# Patient Record
Sex: Male | Born: 1995 | Race: White | Hispanic: No | Marital: Married | Smoking: Never smoker
Health system: Southern US, Community
[De-identification: ages and names within clinical notes are randomized; demographics above are authoritative.]

## PROBLEM LIST (undated history)

## (undated) DIAGNOSIS — B029 Zoster without complications: Secondary | ICD-10-CM

## (undated) DIAGNOSIS — S060X9A Concussion with loss of consciousness of unspecified duration, initial encounter: Secondary | ICD-10-CM

## (undated) DIAGNOSIS — J020 Streptococcal pharyngitis: Secondary | ICD-10-CM

## (undated) DIAGNOSIS — F419 Anxiety disorder, unspecified: Secondary | ICD-10-CM

## (undated) DIAGNOSIS — T7840XA Allergy, unspecified, initial encounter: Secondary | ICD-10-CM

## (undated) DIAGNOSIS — K219 Gastro-esophageal reflux disease without esophagitis: Secondary | ICD-10-CM

## (undated) HISTORY — DX: Gastro-esophageal reflux disease without esophagitis: K21.9

## (undated) HISTORY — DX: Anxiety disorder, unspecified: F41.9

## (undated) HISTORY — DX: Streptococcal pharyngitis: J02.0

## (undated) HISTORY — DX: Allergy, unspecified, initial encounter: T78.40XA

## (undated) HISTORY — DX: Zoster without complications: B02.9

## (undated) HISTORY — PX: WISDOM TOOTH EXTRACTION: SHX21

## (undated) HISTORY — DX: Concussion with loss of consciousness of unspecified duration, initial encounter: S06.0X9A

---
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
## 2015-09-10 ENCOUNTER — Ambulatory Visit (INDEPENDENT_AMBULATORY_CARE_PROVIDER_SITE_OTHER): Payer: BLUE CROSS/BLUE SHIELD | Admitting: Primary Care

## 2015-09-10 VITALS — BP 120/72 | HR 69 | Temp 98.1°F | Ht 66.25 in | Wt 169.4 lb

## 2015-09-10 DIAGNOSIS — H6123 Impacted cerumen, bilateral: Secondary | ICD-10-CM | POA: Diagnosis not present

## 2015-09-10 DIAGNOSIS — H938X2 Other specified disorders of left ear: Secondary | ICD-10-CM | POA: Diagnosis not present

## 2015-09-10 NOTE — Progress Notes (Signed)
   Subjective:    Patient ID: MILAM ALLBAUGH, male    DOB: November 19, 1995, 20 y.o.   MRN: [id number]  HPI  Mr. Nazaire is a 20 year old male who presents today with a chief complaint of ear fullness. The fullness is located to the left ear and has been present since Saturday morning. He cleaned out his left ear at home with a ear wax removal kit last night. He was able to remove some wax but continues to experience fullness. Denies pain, fevers, sore throat, cough, right sided pain. He's taken Claritin once without much improvement.    Review of Systems  Constitutional: Negative for fever.  HENT: Negative for congestion, ear pain, rhinorrhea and sore throat.        Ear fullness  Respiratory: Negative for cough.   Musculoskeletal: Negative for myalgias.       Past Medical History  Diagnosis Date  . Strep throat   . Zoster 11/2009  . Concussion 2009    x2 (football/baseball)     Social History   Social History  . Marital Status: Single    Spouse Name: N/A  . Number of Children: N/A  . Years of Education: N/A   Occupational History  . Not on file.   Social History Main Topics  . Smoking status: Never Smoker   . Smokeless tobacco: Never Used  . Alcohol Use: No  . Drug Use: No  . Sexual Activity: Not on file   Other Topics Concern  . Not on file   Social History Narrative  . No narrative on file    No past surgical history on file.  No family history on file.  No Known Allergies  Current Outpatient Prescriptions on File Prior to Visit  Medication Sig Dispense Refill  . fluticasone (FLONASE) 50 MCG/ACT nasal spray Place 2 sprays into both nostrils daily. 16 g 1  . ibuprofen (ADVIL,MOTRIN) 200 MG tablet Take 200 mg by mouth as needed.       No current facility-administered medications on file prior to visit.    BP 120/72 mmHg  Pulse 69  Temp(Src) 98.1 F (36.7 C) (Oral)  Ht 5' 6.25" (1.683 m)  Wt 169 lb 6.4 oz (76.839 kg)  BMI 27.13 kg/m2  SpO2  99%    Objective:   Physical Exam  Constitutional: He appears well-nourished.  HENT:  Right Ear: Tympanic membrane and ear canal normal.  Left Ear: Tympanic membrane and ear canal normal.  Nose: No mucosal edema. Right sinus exhibits no maxillary sinus tenderness and no frontal sinus tenderness. Left sinus exhibits no maxillary sinus tenderness and no frontal sinus tenderness.  Mouth/Throat: Oropharynx is clear and moist.  Bilateral cerumen impaction to canals. TM's post irrigation WNL. No erythema, bulging. BLM's visualized.  Eyes: Conjunctivae are normal.  Neck: Neck supple.  Cardiovascular: Normal rate and regular rhythm.   Pulmonary/Chest: Effort normal and breath sounds normal. He has no wheezes. He has no rales.  Skin: Skin is warm and dry.          Assessment & Plan:  Cerumen Impaction:  Ear fullness for the past several days. No cough, sore throat, fevers, etc. Exam today with moderate cerumen impaction to bilateral canals. TM's post irrigation WNL. No evidence of acute infection. Discussed to avoid q-tips and use of debrox drops to prevent future impaction. Follow up PRN.

## 2015-09-10 NOTE — Patient Instructions (Signed)
Both of your ears were impacted with wax which was contributing to your symptoms.  There is no evidence of infection in your ears.  Continue use of ear wax removal kits or Debrox drops for future impaction.  It was a pleasure meeting you!  Cerumen Impaction The structures of the external ear canal secrete a waxy substance known as cerumen. Excess cerumen can build up in the ear canal, causing a condition known as cerumen impaction. Cerumen impaction can cause ear pain and disrupt the function of the ear. The rate of cerumen production differs for each individual. In certain individuals, the configuration of the ear canal may decrease his or her ability to naturally remove cerumen. CAUSES Cerumen impaction is caused by excessive cerumen production or buildup. RISK FACTORS  Frequent use of swabs to clean ears.  Having narrow ear canals.  Having eczema.  Being dehydrated. SIGNS AND SYMPTOMS  Diminished hearing.  Ear drainage.  Ear pain.  Ear itch. TREATMENT Treatment may involve:  Over-the-counter or prescription ear drops to soften the cerumen.  Removal of cerumen by a health care provider. This may be done with:  Irrigation with warm water. This is the most common method of removal.  Ear curettes and other instruments.  Surgery. This may be done in severe cases. HOME CARE INSTRUCTIONS  Take medicines only as directed by your health care provider.  Do not insert objects into the ear with the intent of cleaning the ear. PREVENTION  Do not insert objects into the ear, even with the intent of cleaning the ear. Removing cerumen as a part of normal hygiene is not necessary, and the use of swabs in the ear canal is not recommended.  Drink enough water to keep your urine clear or pale yellow.  Control your eczema if you have it. SEEK MEDICAL CARE IF:  You develop ear pain.  You develop bleeding from the ear.  The cerumen does not clear after you use ear drops as  directed.   This information is not intended to replace advice given to you by your health care provider. Make sure you discuss any questions you have with your health care provider.   Document Released: 04/16/2004 Document Revised: 03/30/2014 Document Reviewed: 10/24/2014 Elsevier Interactive Patient Education [employer]2016 Elsevier Inc.

## 2015-09-10 NOTE — Progress Notes (Signed)
Pre visit review using our clinic review tool, if applicable. No additional management support is needed unless otherwise documented below in the visit note. 

## 2016-04-01 ENCOUNTER — Encounter: Payer: Self-pay | Admitting: Family Medicine

## 2016-04-01 ENCOUNTER — Ambulatory Visit (INDEPENDENT_AMBULATORY_CARE_PROVIDER_SITE_OTHER): Payer: BLUE CROSS/BLUE SHIELD | Admitting: Family Medicine

## 2016-04-01 VITALS — BP 110/72 | HR 80 | Temp 98.4°F | Wt 166.8 lb

## 2016-04-01 DIAGNOSIS — J302 Other seasonal allergic rhinitis: Secondary | ICD-10-CM

## 2016-04-01 DIAGNOSIS — B9789 Other viral agents as the cause of diseases classified elsewhere: Secondary | ICD-10-CM | POA: Diagnosis not present

## 2016-04-01 DIAGNOSIS — J069 Acute upper respiratory infection, unspecified: Secondary | ICD-10-CM

## 2016-04-01 MED ORDER — FLUTICASONE PROPIONATE 50 MCG/ACT NA SUSP: 2.0000 | Freq: Every day | NASAL | 1 refills | Status: DC

## 2016-04-01 NOTE — Patient Instructions (Addendum)
For nasal congestion you can use Afrin nasal spray for 3 days max, Sudafed, saline nasal spray (generic is fine for all). For cough you can try Delsym. Drink enough fluids to make your urine light yellow. For fever/chill/muscle aches you can take over the counter acetaminophen or ibuprofen.  Please come back in if you are not better in 5-7 days or if you develop wheezing, shortness of breath or persistent vomiting.  Start Flonase when leaves start to come on trees.

## 2016-04-01 NOTE — Progress Notes (Signed)
   Subjective:    Patient ID: Dillon BalesCody G Mayers, male    DOB: Jul 28, 1995, 21 y.o.   MRN: [id number]  HPI This is a 21 yo male who presents today with 2 days of runny nose and sore throat. Some congestion. No cough. Slight headache. No ear pain. No fever. Has taken some Claritin without relief. Father with flu like illness. Has chronic spring allergies. No history asthma. Has had frequent strep, this feels different.    Past Medical History:  Diagnosis Date  . Concussion 2009   x2 (football/baseball)  . Strep throat   . Zoster 11/2009   No past surgical history on file. No family history on file. Social History  Substance Use Topics  . Smoking status: Never Smoker  . Smokeless tobacco: Never Used  . Alcohol use No      Review of Systems Per HPI    Objective:   Physical Exam  Constitutional: He is oriented to person, place, and time. He appears well-developed and well-nourished. No distress.  HENT:  Head: Normocephalic and atraumatic.  Right Ear: Tympanic membrane, external ear and ear canal normal.  Left Ear: Tympanic membrane, external ear and ear canal normal.  Nose: Mucosal edema and rhinorrhea present. Right sinus exhibits no maxillary sinus tenderness and no frontal sinus tenderness. Left sinus exhibits no maxillary sinus tenderness and no frontal sinus tenderness.  Mouth/Throat: Uvula is midline and oropharynx is clear and moist.  Eyes: Conjunctivae are normal.  Neck: Normal range of motion. Neck supple.  Cardiovascular: Normal rate, regular rhythm and normal heart sounds.   Pulmonary/Chest: Effort normal and breath sounds normal.  Lymphadenopathy:    He has no cervical adenopathy.  Neurological: He is alert and oriented to person, place, and time.  Skin: Skin is warm and dry. He is not diaphoretic.  Psychiatric: He has a normal mood and affect. His behavior is normal. Judgment and thought content normal.  Vitals reviewed.     BP 110/72 (BP Location: Left Arm,  Patient Position: Sitting, Cuff Size: Normal)   Pulse 80   Temp 98.4 F (36.9 C) (Oral)   Wt 166 lb 12 oz (75.6 kg)   SpO2 97%   BMI 26.71 kg/m  Wt Readings from Last 3 Encounters:  04/01/16 166 lb 12 oz (75.6 kg)  09/10/15 169 lb 6.4 oz (76.8 kg) (71 %, Z= 0.56)*  01/30/13 166 lb (75.3 kg) (81 %, Z= 0.88)*   * Growth percentiles are based on CDC 2-20 Years data.       Assessment & Plan:  1. Viral URI -  Patient Instructions  For nasal congestion you can use Afrin nasal spray for 3 days max, Sudafed, saline nasal spray (generic is fine for all). For cough you can try Delsym. Drink enough fluids to make your urine light yellow. For fever/chill/muscle aches you can take over the counter acetaminophen or ibuprofen.  Please come back in if you are not better in 5-7 days or if you develop wheezing, shortness of breath or persistent vomiting.  Start Flonase when leaves start to come on trees.     2. Chronic seasonal allergic rhinitis, unspecified trigger - fluticasone (FLONASE) 50 MCG/ACT nasal spray; Place 2 sprays into both nostrils daily.  Dispense: 16 g; Refill: 1   Olean Reeeborah Gessner, FNP-BC  [facility] [place], [facility]  04/01/2016 8:23 AM

## 2018-10-07 DIAGNOSIS — S8002XA Contusion of left knee, initial encounter: Secondary | ICD-10-CM | POA: Insufficient documentation

## 2019-09-21 DIAGNOSIS — U071 COVID-19: Secondary | ICD-10-CM

## 2019-09-21 HISTORY — DX: COVID-19: U07.1

## 2019-10-09 ENCOUNTER — Telehealth: Payer: Self-pay

## 2019-10-09 NOTE — Telephone Encounter (Signed)
Discussed with Dr. Reece Agar and he said he will keep the Dillon Park and if Dillon Park is having any symptoms Dillon Park should have a VV.   Contacted mother, Marylene Land, and she reports Dillon Park is not home from college but home from a trip. Dillon Park is currently c/o cough but denies all other symptoms. Next available opening for VV is 7/23. Scheduled Dillon Park and sent link for mychart. Explained VV and how it will work. Advised if any new or worsening symptoms to contact office. Marylene Land verbalized understanding.

## 2019-10-09 NOTE — Telephone Encounter (Signed)
[facility] Night - Client TELEPHONE ADVICE RECORD AccessNurse Patient Name: HJALMAR BALLENGEE Gender: Male DOB: [birth year] Age: 24 Y 7 M 6 D Return Phone Number: [phone] (Primary), [phone] (Secondary) Address: [place] [address] Client [facility] Night - Client Client Site [facility] - Night Physician Raechel Ache - MD Contact Type Call Who Is Calling Patient / Member / Family / Caregiver Call Type Triage / Clinical Caller Name Dhanush Jokerst Relationship To Patient Mother Return Phone Number [phone] (Primary) Chief Complaint Headache Reason for Call Symptomatic / Request for Health Information Initial Comment Son tested pos for covid today. He has headache and temp is 97 (was 101). Translation No Nurse Assessment Nurse: Loletta Specter Date/Time Lamount Cohen Time): 10/08/2019 3:34:04 PM Confirm and document reason for call. If symptomatic, describe symptoms. ---Son tested positive for Covid today. He has headache and temp is 97 (was 101 orally). Has the patient had close contact with a person known or suspected to have the novel coronavirus illness OR traveled / lives in area with major community spread (including international travel) in the last 14 days from the onset of symptoms? * If Asymptomatic, screen for exposure and travel within the last 14 days. ---Yes Does the patient have any new or worsening symptoms? ---Yes Will a triage be completed? ---Yes Related visit to physician within the last 2 weeks? ---Yes Does the PT have any chronic conditions? (i.e. diabetes, asthma, this includes High risk factors for pregnancy, etc.) ---No Is this a behavioral health or substance abuse call? ---No Guidelines Guideline Title Affirmed Question Affirmed Notes Nurse Date/Time (Eastern Time) COVID-19 - Diagnosed or Suspected [1] COVID-19 diagnosed by positive lab test AND [2] mild  symptoms (e.g., cough, fever, others) AND [3] no complications or SOB Loletta Specter 10/08/2019 3:35:40 PM Disp. Time (Eastern Time) Disposition Final UserPLEASE NOTE: All timestamps contained within this report are represented as [place] Standard Time. CONFIDENTIALTY NOTICE: This fax transmission is intended only for the addressee. It contains information that is legally privileged, confidential or otherwise protected from use or disclosure. If you are not the intended recipient, you are strictly prohibited from reviewing, disclosing, copying using or disseminating any of this information or taking any action in reliance on or regarding this information. If you have received this fax in error, please notify us immediately by telephone so that we can arrange for its return to [place]. Phone: [phone], Toll-Free: [phone], Fax: [phone] Page: 2 of 2 Call Id: [id number] 10/08/2019 3:45:28 PM Home Care Yes Loletta Specter Caller Disagree/Comply Comply Caller Understands Yes PreDisposition Search internet for information Care Advice Given Per Guideline HOME CARE: * You should be able to treat this at home. * You had a lab test for COVID-19 and it came back positive. * Muscle aches, headache, and other pains: Often this comes and goes with the fever. Take acetaminophen every 4-6 hours (Adults 650 mg) OR ibuprofen every 6 to 8 hours (Adults 400 mg). Before taking any medicine, read all the instructions on the package. * Feeling dehydrated: Drink extra liquids. If the air in your home is dry, use a humidifier. * HOME REMEDY - HONEY: This old home remedy has been shown to help decrease coughing at night. The adult dosage is 2 teaspoons (10 ml) at bedtime. Honey should not be given to infants under one year of age. * If the air is dry, use a humidifier in the bedroom. HUMIDIFIER: HOW TO PROTECT OTHERS - WHEN  YOU ARE SICK WITH COVID-19: * STAY HOME A MINIMUM OF 10 DAYS: Home isolation is needed for at  least 10 days after the symptoms started. Stay home from school or work if you are sick. Do NOT go to religious services, child care centers, shopping, or other public places. Do NOT use public transportation (e.g., bus, taxis, ride-sharing). Do NOT allow any visitors to your home. Leave the house only if you need to seek urgent medical care. * COVER THE COUGH: Cough and sneeze into your shirt sleeve or inner elbow. Don't cough into your hand or the air. If available, cough into a tissue and throw it into a trash can. OTHER COVID-19 FACTS: * INCUBATION PERIOD: Average 5 days (range 2 to 14 days) after coming in contact with a person who has COVID-19 virus. * WEAR A MASK: Wear a facemask when around others. Always wear a facemask (if available) if you have to leave your home (such as going to a medical facility). * WASH HANDS OFTEN: Wash hands often with soap and water. After coughing or sneezing are important times. If soap and water are not available, use an alcohol-based hand sanitizer with at least 60% alcohol, covering all surfaces of your hands and rubbing them together until they feel dry. Avoid touching your eyes, nose, and mouth with unwashed hands. CALL BACK IF: * Fever over 103 F (39.4 C) * Fever lasts over 3 days * You become worse. CARE ADVICE given per COVID-19 - DIAGNOSED OR SUSPECTED (Adult) guideline. * Chest pain or difficulty breathing occurs

## 2019-10-09 NOTE — Telephone Encounter (Signed)
Noted  

## 2019-10-09 NOTE — Telephone Encounter (Signed)
Contacted pt's mother, Marylene Land, and advised pt is considered a new pt since he has not been seen in over 3 years and has not been seen by his PCP in over 7 years.  She reports pt came home on Friday and was not feeling well and was tested at [facility] and was positive. They are supposed to call with instructions on what to do. Advised to quarantine until contacted and to have [place] contact office after quarantine and reestablish care with a new provider. Advised to contact office if any problems. Marylene Land verbalized understanding.

## 2019-10-10 NOTE — Telephone Encounter (Signed)
Noted. I could see Thurs at 10am (30 min appt)  Or would offer sooner virtual appt with another provider if sooner availability and ok with the other provider.

## 2019-10-11 NOTE — Telephone Encounter (Signed)
Contacted pt's father, Gery Pray, who reports Dillon Park is sleeping and has been tired since yesterday. Amante is doing better and does not have any new symptoms. He and their other son have been tested and both are negative. Dillon Park has not been tested yet. Opening on schedule has been filled. Advised if any changes in symptoms or Dillon Park has changes in symptoms to contact this office. Gery Pray verbalized understanding.

## 2019-10-13 ENCOUNTER — Telehealth: Payer: Self-pay

## 2019-10-13 ENCOUNTER — Other Ambulatory Visit: Payer: Self-pay

## 2019-10-13 ENCOUNTER — Encounter: Payer: Self-pay | Admitting: Family Medicine

## 2019-10-13 ENCOUNTER — Telehealth: Payer: Self-pay | Admitting: Family Medicine

## 2019-10-13 DIAGNOSIS — U071 COVID-19: Secondary | ICD-10-CM | POA: Insufficient documentation

## 2019-10-13 NOTE — Telephone Encounter (Signed)
Spoke with pt getting him ready for virtual visit.  

## 2019-10-13 NOTE — Telephone Encounter (Signed)
Called one last time to try to get pt on for video visit.  Spoke with pt's mom.  She says pt does not want the appt.  She was who initially scheduled it and pt is not interested.  FYI to Dr. Reece Agar.

## 2019-10-13 NOTE — Assessment & Plan Note (Signed)
Visit not completed.

## 2019-10-13 NOTE — Telephone Encounter (Signed)
Left message with pt's dad asking him call our office to prepare for virtual visit.  Dr. Reece Agar may be able to see pt this morning.  Dad will have pt call back.

## 2019-10-13 NOTE — Progress Notes (Addendum)
   Virtual visit attempted through MyChart, a video enabled telemedicine application. Due to national recommendations of social distancing due to COVID-19, a virtual visit is felt to be most appropriate for this patient at this time.   If any vitals were documented, they were collected by patient at home unless specified below.    Temp 97.8 F (36.6 C)   Ht 5\' 8"  (1.727 m)   Wt 170 lb (77.1 kg)   BMI 25.85 kg/m    CC: COVID positive Subjective:    Patient ID: Dillon Park, male    DOB: 1995-06-17, 24 y.o.   MRN: [id number]  HPI: Dillon Park is a 24 y.o. male presenting on 10/13/2019 for Fatigue (States fatigue, loss of taste/smell, body aches and fever. Sxs started 10/06/19   [facility] )   First day of symptoms 10/06/2019.  [facility] test at [facility]/08/2019.   I WAS UNABLE TO REACH PATIENT DESPITE MULTIPLE ATTEMPTS BY MYSELF AND MY CMA. MY CMA WAS FINALLY ABLE TO REACH PATIENT AFTER 4PM - HE STATES HE DOES NOT WANT APPOINTMENT AS HE'S FEELING BETTER.      Relevant past medical, surgical, family and social history reviewed and updated as indicated. Interim medical history since our last visit reviewed. Allergies and medications reviewed and updated. Outpatient Medications Prior to Visit  Medication Sig Dispense Refill  . ibuprofen (ADVIL,MOTRIN) 200 MG tablet Take 200 mg by mouth as needed.      . fluticasone (FLONASE) 50 MCG/ACT nasal spray Place 2 sprays into both nostrils daily. 16 g 1   No facility-administered medications prior to visit.     Per HPI unless specifically indicated in ROS section below Review of Systems Objective:  Temp 97.8 F (36.6 C)   Ht 5\' 8"  (1.727 m)   Wt 170 lb (77.1 kg)   BMI 25.85 kg/m   Wt Readings from Last 3 Encounters:  10/13/19 170 lb (77.1 kg)  04/01/16 166 lb 12 oz (75.6 kg)  09/10/15 169 lb 6.4 oz (76.8 kg) (71 %, Z= 0.56)*   * Growth percentiles are based on CDC (Boys, 2-20 Years) data.           Assessment & Plan:   Problem List Items Addressed This Visit    None       No orders of the defined types were placed in this encounter.  No orders of the defined types were placed in this encounter.   I discussed the assessment and treatment plan with the patient. The patient was provided an opportunity to ask questions and all were answered. The patient agreed with the plan and demonstrated an understanding of the instructions. The patient was advised to call back or seek an in-person evaluation if the symptoms worsen or if the condition fails to improve as anticipated.  Follow up plan: No follow-ups on file.  05/30/16, MD

## 2020-03-23 HISTORY — PX: BUNIONECTOMY: SHX129

## 2020-04-01 ENCOUNTER — Other Ambulatory Visit: Payer: Self-pay

## 2020-04-01 ENCOUNTER — Ambulatory Visit (INDEPENDENT_AMBULATORY_CARE_PROVIDER_SITE_OTHER): Payer: BC Managed Care – PPO

## 2020-04-01 ENCOUNTER — Ambulatory Visit (INDEPENDENT_AMBULATORY_CARE_PROVIDER_SITE_OTHER): Payer: BC Managed Care – PPO | Admitting: Podiatry

## 2020-04-01 ENCOUNTER — Encounter: Payer: Self-pay | Admitting: Podiatry

## 2020-04-01 DIAGNOSIS — M2011 Hallux valgus (acquired), right foot: Secondary | ICD-10-CM | POA: Diagnosis not present

## 2020-04-01 DIAGNOSIS — M2010 Hallux valgus (acquired), unspecified foot: Secondary | ICD-10-CM

## 2020-04-01 DIAGNOSIS — B351 Tinea unguium: Secondary | ICD-10-CM

## 2020-04-01 DIAGNOSIS — M2012 Hallux valgus (acquired), left foot: Secondary | ICD-10-CM

## 2020-04-01 DIAGNOSIS — M21611 Bunion of right foot: Secondary | ICD-10-CM

## 2020-04-01 DIAGNOSIS — B353 Tinea pedis: Secondary | ICD-10-CM

## 2020-04-01 DIAGNOSIS — M21612 Bunion of left foot: Secondary | ICD-10-CM

## 2020-04-01 MED ORDER — TERBINAFINE HCL 250 MG PO TABS: 250.0000 mg | ORAL_TABLET | Freq: Every day | ORAL | 0 refills | Status: AC

## 2020-04-01 NOTE — Patient Instructions (Addendum)
The specific procedure I would do for your bunions is known as a "Lapidus procedure" if you would like to do more research on this   Bunion Surgery  Bunion surgery is done to remove a bunion, which is a bump that forms slowly on the inner side of the big toe joint. A bunion can develop over time when pressure turns the big toe toward the second toe. Bunions may be caused by wearing shoes that are narrow or pointed. They can also be caused by certain conditions, such as rheumatoid arthritis and flat feet. You may need bunion surgery if your bunion is very large or painful or it affects your ability to walk. Tell a health care provider about:  Any allergies you have.  All medicines you are taking, including vitamins, herbs, eye drops, creams, and over-the-counter medicines.  Any problems you or family members have had with anesthetic medicines.  Any blood disorders you have.  Any surgeries you have had.  Any medical conditions you have.  Whether you are pregnant or may be pregnant. What are the risks? Generally, this is a safe procedure. However, problems may occur, including:  Infection.  Bleeding or blood clots.  Allergic reactions to medicines.  Nerve damage.  Pain, numbness, stiffness, or arthritis in the toe.  Return of the bunion.  Failure of the bone to heal completely after surgery, or failure of the surgery to relieve symptoms. What happens before the procedure? Staying hydrated Follow instructions from your health care provider about hydration, which may include:  Up to 2 hours before the procedure - you may continue to drink clear liquids, such as water, clear fruit juice, black coffee, and plain tea.   Eating and drinking restrictions  Follow instructions from your health care provider about eating and drinking, which may include: ? 8 hours before the procedure - stop eating heavy meals or foods, such as meat, fried foods, or fatty foods. ? 6 hours before the  procedure - stop eating light meals or foods, such as toast or cereal. ? 6 hours before the procedure - stop drinking milk or drinks that contain milk. ? 2 hours before the procedure - stop drinking clear liquids.  Do not drink alcohol for the length of time you are told by your health care provider. Medicines Ask your health care provider about:  Changing or stopping your regular medicines. This is especially important if you are taking diabetes medicines or blood thinners.  Taking medicines such as aspirin and ibuprofen. These medicines can thin your blood. Do not take these medicines unless your health care provider tells you to take them.  Taking over-the-counter medicines, vitamins, herbs, and supplements. General instructions  Do not use any products that contain nicotine or tobacco for at least 4 weeks before the procedure. These products include cigarettes, e-cigarettes, and chewing tobacco. If you need help quitting, ask your health care provider.  Plan to have a responsible adult take you home from the hospital or clinic.  If you will be going home right after the procedure, plan to have a responsible adult care for you for the time you are told. This is important.  Ask your health care provider: ? How your surgery site will be marked. ? What steps will be taken to help prevent infection. These steps may include:  Washing skin with a germ-killing soap.  Taking antibiotic medicine. What happens during the procedure?  An IV will be inserted into one of your veins.  You will  be given one or more of the following: ? A medicine to help you relax (sedative). ? A medicine to numb the area (local anesthetic). ? A medicine that is injected into an area of your body to numb everything below the injection site (regional anesthetic). ? A medicine to make you fall asleep (general anesthetic).  An incision will be made over the bump at the big toe joint. Depending on the type of  procedure that is needed for your bunion, your surgeon may do one or more of the following: ? Exostectomy. This is a procedure to remove the bunion. ? Osteotomy. This is a procedure to cut and realign the damaged joint in your big toe. ? Arthrodesis. For the procedure, hardware, such as screws, is used to keep your foot in the correct position. ? Tightening or loosening tissues around the big toe to reposition the toe.  The incision will be closed with stitches (sutures) and covered with adhesive strips or another type of bandage (dressing).  A supportive device, such as a boot, may be placed on your foot. A boot is a cast that you can walk on. The procedure may vary among health care providers and hospitals. What happens after the procedure?  Your blood pressure, heart rate, breathing rate, and blood oxygen level will be monitored until you leave the hospital or clinic.  If you were given a sedative during the procedure, it can affect you for several hours. Do not drive or operate machinery until your health care provider says that it is safe.  Ask your health care provider when it is safe to drive if you have a boot or other supportive device on your foot.  You may be given instructions about weight-bearing restrictions. These instructions tell you how much body weight you can or cannot support on your foot. You may be given crutches, a cane, or a walker to help you move around so that you do not put (bear) weight on your foot. Summary  Bunion surgery is done to remove a bunion, which is a bump that forms slowly on the inner side of the big toe joint.  You may need bunion surgery if your bunion is very large or painful or it affects your ability to walk.  Before the procedure, follow instructions from your health care provider about eating and drinking, and ask about changing or stopping your regular medicines. This information is not intended to replace advice given to you by your health  care provider. Make sure you discuss any questions you have with your health care provider. Document Revised: 07/14/2019 Document Reviewed: 07/14/2019 Elsevier Patient Education  2021 [employer]   Bunion A bunion (hallux valgus) is a bump that forms slowly on the inner side of the big toe joint. It occurs when the big toe turns toward the second toe. Bunions may be small at first, but they often get larger over time. They can make walking painful. What are the causes? This condition may be caused by:  Wearing narrow or pointed shoes that force the big toe to press against the other toes.  Abnormal foot development that causes the foot to roll inward.  Changes in the foot that are caused by certain diseases, such as rheumatoid arthritis or polio.  A foot injury. What increases the risk? The following factors may make you more likely to develop this condition:  Wearing shoes that squeeze the toes together.  Having certain diseases, such as: ? Rheumatoid arthritis. ? Polio. ?  Cerebral palsy.  Having family members who have bunions.  Being born with abnormally shaped feet (a foot deformity), such as flat feet or low arches.  Doing activities that put a lot of pressure on the feet, such as ballet dancing. What are the signs or symptoms? The main symptom of this condition is a bump on your big toe that you can notice. Other symptoms may include:  Pain.  Redness and inflammation around your big toe.  Thick or hardened skin on your big toe or between your toes.  Stiffness or loss of motion in your big toe.  Trouble with walking.   How is this diagnosed? This condition may be diagnosed based on your symptoms, medical history, and activities. You may also have tests and imaging, such as:  X-rays. These allow your health care provider to check the position of the bones in your foot and look for damage to your joint. They also help your health care provider determine the  severity of your bunion and the best way to treat it.  Joint aspiration. In this test, a sample of fluid is removed from the toe joint. This test may be done if you are in a lot of pain. It helps rule out diseases that cause painful swelling of the joints, such as arthritis or gout. How is this treated? Treatment depends on the severity of your symptoms. The goal of treatment is to relieve symptoms and prevent your bunion from getting worse. Your health care provider may recommend:  Wearing shoes that have a wide toe box, or using bunion pads to cushion the affected area.  Taping your toes together to keep them in a normal position.  Placing a device inside your shoe (orthotic device) to help reduce pressure on your toe joint.  Taking medicine to ease pain and inflammation.  Putting ice or heat on the affected area.  Doing stretching exercises.  Surgery, for severe cases. Follow these instructions at home: Managing pain, stiffness, and swelling  If directed, put ice on the painful area. To do this: ? Put ice in a plastic bag. ? Place a towel between your skin and the bag. ? Leave the ice on for 20 minutes, 2-3 times a day. ? Remove the ice if your skin turns bright red. This is very important. If you cannot feel pain, heat, or cold, you have a greater risk of damage to the area.  If directed, apply heat to the affected area before you exercise. Use the heat source that your health care provider recommends, such as a moist heat pack or a heating pad. ? Place a towel between your skin and the heat source. ? Leave the heat on for 20-30 minutes. ? Remove the heat if your skin turns bright red. This is especially important if you are unable to feel pain, heat, or cold. You have a greater risk of getting burned.      General instructions  Do exercises as told by your health care provider.  Support your toe joint with proper footwear, shoe padding, or taping as told by your health care  provider.  Take over-the-counter and prescription medicines only as told by your health care provider.  Do not use any products that contain nicotine or tobacco, such as cigarettes, e-cigarettes, and chewing tobacco. If you need help quitting, ask your health care provider.  Keep all follow-up visits. This is important. Contact a health care provider if:  Your symptoms get worse.  Your symptoms do  not improve in 2 weeks. Get help right away if:  You have severe pain and trouble with walking. Summary  A bunion is a bump on the inner side of the big toe joint that forms when the big toe turns toward the second toe.  Bunions can make walking painful.  Treatment depends on the severity of your symptoms.  Support your toe joint with proper footwear, shoe padding, or taping as told by your health care provider. This information is not intended to replace advice given to you by your health care provider. Make sure you discuss any questions you have with your health care provider. Document Revised: 07/14/2019 Document Reviewed: 07/14/2019 Elsevier Patient Education  2021 [employer].

## 2020-04-02 ENCOUNTER — Encounter: Payer: Self-pay | Admitting: Podiatry

## 2020-04-02 NOTE — Progress Notes (Signed)
  Subjective:  Patient ID: Dillon Park, male    DOB: June 26, 1995,  MRN: [id number]  Chief Complaint  Patient presents with  . Bunions  . Nail Problem    Patient presents today for bilat bunions x years and nail fungus left great toe, 3rd and 4th toe x 2 years    25 y.o. male presents with the above complaint. History confirmed with patient.  The bunions and his feet hurt most when he is playing hockey.  Because of the width of the bunions he has had to buy larger skates.  This is causing him to piston in his shoes and cause damage to the toenails.  He also notes discoloration and the hallux toenail in the left side most the pain is along the arch just behind the big toe  Objective:  Physical Exam: warm, good capillary refill, no trophic changes or ulcerative lesions, normal DP and PT pulses and normal sensory exam.  Bilaterally he has moderate to severe bunions with hypermobility of the first tarsometatarsal joint.  Pinch callus on the medial MTPJ.  Pain on palpation along the abductor hallucis muscle belly.  He has discoloration of the left hallux, third and fourth digits with hyperkeratosis and thickening of the nail plate.  Mild tinea pedis interdigitally and the plantar distal foot  Radiographs: X-ray of both feet: Moderate to severe hallux valgus with increased IM angle and hallux abduction Assessment:   1. Valgus deformity of great toe, unspecified laterality   2. Hallux valgus with bunions, left   3. Hallux valgus with bunions, right   4. Onychomycosis   5. Tinea pedis of both feet      Plan:  Patient was evaluated and treated and all questions answered.  Discussed the etiology and treatment options for the condition in detail with the patient. Educated patient on the topical and oral treatment options for mycotic nails.  Discussed treatment options including appropriate shoe gear. Follow up as needed for painful nails.  Recommended oral treatment with Lamisil.  90-day course  sent to his pharmacy.  This will also be helpful to treat his tinea pedis.  If that does not improve with oral therapy we will also add topical therapy  Discussed the etiology and treatment including surgical and non surgical treatment for painful bunions.  He has exhausted all non surgical treatment prior to this visit including shoe gear changes and padding.  He desires surgical intervention. We discussed all risks including but not limited to: pain, swelling, infection, scar, numbness which may be temporary or permanent, chronic pain, stiffness, nerve pain or damage, wound healing problems, bone healing problems including delayed or non-union and recurrence. Specifically we discussed the following procedures: Lapidus bunionectomy bilaterally stage I foot at a time.  I discussed with him that for the right side he would not be able to drive after surgery.  We discussed the postoperative course and period of nonweightbearing for approximately 2 weeks before he is allowed to partially weight-bear in a CAM boot.  He would like to plan for surgery in the summer when it is off season for hockey.  He will return in a few months to schedule surgery and sign consent     Return in about 10 weeks (around 06/10/2020) for nail re-check.

## 2020-04-09 ENCOUNTER — Telehealth: Payer: Self-pay

## 2020-04-09 NOTE — Telephone Encounter (Signed)
Pt would like to know approximately how much the bunion surgery would cost, how long the recovery time for going back to regular hockey routine and if this could be done late April? Please advise.

## 2020-04-09 NOTE — Telephone Encounter (Signed)
Cost-wise it depends on his insurance, it is covered by insurance but it depends on what his out of pocket plan, deductible (and if he's met any of it), etc is. We can give him a rough idea but not until it's scheduled and authorized. April is wide open for me right now. I will need to see him before then to sign consent and pick an exact date (will be a Friday). Recovery wise, he will be no weight in a splint/cast for 2 weeks, then partial weight in a boot for about 2-3 weeks and then full weight for 2-3 weeks. Expect to not be in regular shoes (and hockey skates) until at least 2 months out from surgery. Possibly 3 if he has issues

## 2020-06-05 ENCOUNTER — Ambulatory Visit (INDEPENDENT_AMBULATORY_CARE_PROVIDER_SITE_OTHER): Payer: BC Managed Care – PPO | Admitting: Podiatry

## 2020-06-05 ENCOUNTER — Encounter: Payer: Self-pay | Admitting: Podiatry

## 2020-06-05 ENCOUNTER — Other Ambulatory Visit: Payer: Self-pay

## 2020-06-05 DIAGNOSIS — M89771 Major osseous defect, right ankle and foot: Secondary | ICD-10-CM | POA: Diagnosis not present

## 2020-06-05 DIAGNOSIS — B351 Tinea unguium: Secondary | ICD-10-CM | POA: Diagnosis not present

## 2020-06-05 DIAGNOSIS — M2011 Hallux valgus (acquired), right foot: Secondary | ICD-10-CM

## 2020-06-05 DIAGNOSIS — B353 Tinea pedis: Secondary | ICD-10-CM

## 2020-06-05 DIAGNOSIS — M21611 Bunion of right foot: Secondary | ICD-10-CM

## 2020-06-09 NOTE — Progress Notes (Signed)
  Subjective:  Patient ID: Dillon Park, male    DOB: July 13, 1995,  MRN: [id number]  Chief Complaint  Patient presents with  . Bunions    Surgical consent for right foot    "the medicine killed the athletes foot immediately and the nails are slightly better"    25 y.o. male presents with the above complaint. History confirmed with patient.  He has noticed improvement with the terbinafine especially with the skin.  He is ready to schedule his bunion surgery.  Objective:  Physical Exam: warm, good capillary refill, no trophic changes or ulcerative lesions, normal DP and PT pulses and normal sensory exam.  Bilaterally he has moderate to severe bunions with hypermobility of the first tarsometatarsal joint.  Pinch callus on the medial MTPJ.  Pain on palpation along the abductor hallucis muscle belly.  Nails with proximal clearing tinea pedis resolved  Radiographs: X-ray of both feet: Moderate to severe hallux valgus with increased IM angle and hallux abduction Assessment:   1. Hallux valgus with bunions, right   2. Major osseous defect, right ankle and foot   3. Tinea pedis of both feet   4. Onychomycosis      Plan:  Patient was evaluated and treated and all questions answered.  Today we again discussed the etiology and treatment including surgical and non surgical treatment for painful bunions.  He has exhausted all non surgical treatment prior to this visit including shoe gear changes and padding.  He desires surgical intervention. We discussed all risks including but not limited to: pain, swelling, infection, scar, numbness which may be temporary or permanent, chronic pain, stiffness, nerve pain or damage, wound healing problems, bone healing problems including delayed or non-union and recurrence. Specifically we discussed the following procedures: Lapidus bunionectomy bilaterally stage I foot at a time.  I discussed with him that for the right side he would not be able to drive after  surgery.  He would like to do this first and is in the symptomatic we discussed the postoperative course and period of nonweightbearing for approximately 2 weeks before he is allowed to partially weight-bear in a CAM boot.  Informed consent was signed and reviewed   Surgical plan:  Procedure: -Lapidus bunionectomy with calcaneal autograft right lower extremity  Location: -[facility]  Anesthesia plan: -IV sedation with regional block  Postoperative pain plan: - Tylenol 1000 mg every 6 hours, ibuprofen 600 mg every 8 hours, gabapentin 300 mg every 8 hours x5 days, oxycodone 5 mg 1-2 tabs every 6 hours only as needed  DVT prophylaxis: -ASA 3 2 5  mg  WB Restrictions / DME needs: -NWB in a CAM boot      No follow-ups on file.

## 2020-06-19 ENCOUNTER — Encounter: Payer: Self-pay | Admitting: Podiatry

## 2020-06-20 ENCOUNTER — Telehealth: Payer: Self-pay | Admitting: Urology

## 2020-06-20 NOTE — Telephone Encounter (Signed)
DOS - 07/12/20  Quintella Reichert OSTEOTOMY RIGHT --- 40814 LAPIDUS PROCEDURE INCLUDING BUNIONECTOMY RIGHT -48185 GRAFT FROM HEEL BONE --- 20900  [employer] EFFECTIVE DATE - 03/23/20   PLAN DEDUCTIBLE - $4,000.00 W/ $3,589.01 REMAINING OUT OF POCKET - $4,000.00 W/ $3,589.01 REMAINING COPAY - $0.00 COINSURANCE - 0%   PER [employer] WEBSITE NO PRIOR AUTH IS REQUIRED FOR CPT CODES 63149, 70263 AND  20900.

## 2020-07-12 ENCOUNTER — Other Ambulatory Visit: Payer: Self-pay | Admitting: Podiatry

## 2020-07-12 DIAGNOSIS — M89771 Major osseous defect, right ankle and foot: Secondary | ICD-10-CM | POA: Diagnosis not present

## 2020-07-12 DIAGNOSIS — M2011 Hallux valgus (acquired), right foot: Secondary | ICD-10-CM | POA: Diagnosis not present

## 2020-07-12 DIAGNOSIS — M2041 Other hammer toe(s) (acquired), right foot: Secondary | ICD-10-CM | POA: Diagnosis not present

## 2020-07-12 DIAGNOSIS — M25571 Pain in right ankle and joints of right foot: Secondary | ICD-10-CM | POA: Diagnosis not present

## 2020-07-12 DIAGNOSIS — M205X1 Other deformities of toe(s) (acquired), right foot: Secondary | ICD-10-CM | POA: Diagnosis not present

## 2020-07-12 MED ORDER — ASPIRIN EC 325 MG PO TBEC: 325.0000 mg | DELAYED_RELEASE_TABLET | Freq: Two times a day (BID) | ORAL | 0 refills | Status: AC

## 2020-07-12 MED ORDER — OXYCODONE HCL 5 MG PO TABS: 5.0000 mg | ORAL_TABLET | ORAL | 0 refills | Status: AC | PRN

## 2020-07-12 MED ORDER — ACETAMINOPHEN 500 MG PO TABS: 1000.0000 mg | ORAL_TABLET | Freq: Four times a day (QID) | ORAL | 0 refills | Status: AC | PRN

## 2020-07-12 MED ORDER — IBUPROFEN 600 MG PO TABS: 600.0000 mg | ORAL_TABLET | Freq: Three times a day (TID) | ORAL | 0 refills | Status: AC | PRN

## 2020-07-12 MED ORDER — GABAPENTIN 300 MG PO CAPS: 300.0000 mg | ORAL_CAPSULE | Freq: Three times a day (TID) | ORAL | 0 refills | Status: DC

## 2020-07-12 NOTE — Progress Notes (Signed)
[facility] Lapidus bunionectomy 07/12/20

## 2020-07-17 ENCOUNTER — Encounter: Payer: Self-pay | Admitting: Podiatry

## 2020-07-17 ENCOUNTER — Ambulatory Visit (INDEPENDENT_AMBULATORY_CARE_PROVIDER_SITE_OTHER): Payer: BC Managed Care – PPO

## 2020-07-17 ENCOUNTER — Other Ambulatory Visit: Payer: Self-pay

## 2020-07-17 ENCOUNTER — Ambulatory Visit (INDEPENDENT_AMBULATORY_CARE_PROVIDER_SITE_OTHER): Payer: BC Managed Care – PPO | Admitting: Podiatry

## 2020-07-17 VITALS — BP 119/59 | HR 74 | Temp 98.7°F

## 2020-07-17 DIAGNOSIS — M2011 Hallux valgus (acquired), right foot: Secondary | ICD-10-CM

## 2020-07-17 DIAGNOSIS — M21611 Bunion of right foot: Secondary | ICD-10-CM

## 2020-07-17 DIAGNOSIS — Z9889 Other specified postprocedural states: Secondary | ICD-10-CM

## 2020-07-17 MED ORDER — CEPHALEXIN 500 MG PO CAPS
500.0000 mg | ORAL_CAPSULE | Freq: Three times a day (TID) | ORAL | 0 refills | Status: DC
Start: 2020-07-17 — End: 2022-01-30

## 2020-07-17 NOTE — Progress Notes (Signed)
  Subjective:  Patient ID: Dillon Park, male    DOB: [birth year],  MRN: [id number]  Chief Complaint  Patient presents with  . Routine Post Op    POV #1 DOS 07/12/2020 BUNIONECTOMY (LAPIDUS PROCEDURE) OF THE RT FOOT W/BONE GRAFT FROM HEEL BONE    DOS: 07/12/2020 Procedure: Lapidus and Akin bunionectomy with autograft calcaneus right foot  25 y.o. male returns for post-op check.  Overall doing fairly well his pain is quite severe over the weekend and is proving now.  He is only on Tylenol gabapentin and Motrin.  Had nausea and vomiting the day after surgery.  Review of Systems: Negative except as noted in the HPI. Denies N/V/F/Ch.   Objective:   Vitals:   07/17/20 0952  BP: (!) 119/59  Pulse: 74  Temp: 98.7 F (37.1 C)   There is no height or weight on file to calculate BMI. Constitutional Well developed. Well nourished.  Vascular Foot warm and well perfused. Capillary refill normal to all digits.   Neurologic Normal speech. Oriented to person, place, and time. Epicritic sensation to light touch grossly present bilaterally.  Dermatologic  incisions well coapted.  Moderate edema.  He does have an erythematous rash extending to the ankle  Orthopedic: Tenderness to palpation noted about the surgical site.   Radiographs: Hardware intact and in good correction with consistency from immediate postop films Assessment:   1. Hallux valgus with bunions, right   2. S/P foot surgery, right    Plan:  Patient was evaluated and treated and all questions answered.  S/p foot surgery right -Progressing as expected post-operatively. -XR: As above -WB Status: NWB in CAM boot -Sutures: We will remove next week. -Medications: For the erythema I do not think this is infection but it prescribed him Keflex as an option. -Its possible that this is a allergenic reaction.  No known history of reaction to suture material or metals that he knows of.  Recommend if not improving to try taking  Benadryl to see if this alleviates his -Foot redressed.  Return in about 1 week (around 07/24/2020) for suture removal .

## 2020-07-24 ENCOUNTER — Ambulatory Visit (INDEPENDENT_AMBULATORY_CARE_PROVIDER_SITE_OTHER): Payer: BC Managed Care – PPO | Admitting: Podiatry

## 2020-07-24 ENCOUNTER — Other Ambulatory Visit: Payer: Self-pay

## 2020-07-24 DIAGNOSIS — M2011 Hallux valgus (acquired), right foot: Secondary | ICD-10-CM

## 2020-07-24 DIAGNOSIS — M21611 Bunion of right foot: Secondary | ICD-10-CM

## 2020-07-24 DIAGNOSIS — Z9889 Other specified postprocedural states: Secondary | ICD-10-CM

## 2020-07-25 ENCOUNTER — Encounter: Payer: Self-pay | Admitting: Podiatry

## 2020-07-25 NOTE — Progress Notes (Signed)
  Subjective:  Patient ID: Dillon Park, male    DOB: 11-29-1995,  MRN: [id number]  Chief Complaint  Patient presents with  . Routine Post Op      POV #2 DOS 07/12/2020 BUNIONECTOMY (LAPIDUS PROCEDURE) OF THE RT FOOT W/BONE GRAFT FROM HEEL BONE    DOS: 07/12/2020 Procedure: Lapidus and Akin bunionectomy with autograft calcaneus right foot  25 y.o. male returns for post-op check.  Pain is improved, he still has the rash the took a Benadryl a couple times without any effect  Review of Systems: Negative except as noted in the HPI. Denies N/V/F/Ch.   Objective:   There were no vitals filed for this visit. There is no height or weight on file to calculate BMI. Constitutional Well developed. Well nourished.  Vascular Foot warm and well perfused. Capillary refill normal to all digits.   Neurologic Normal speech. Oriented to person, place, and time. Epicritic sensation to light touch grossly present bilaterally.  Dermatologic  incisions well coapted.  Moderate edema.  Erythematous rash is still present but much improved  Orthopedic: Tenderness to palpation noted about the surgical site.   Radiographs: Hardware intact and in good correction with consistency from immediate postop films Assessment:   1. Hallux valgus with bunions, right   2. S/P foot surgery, right    Plan:  Patient was evaluated and treated and all questions answered.  S/p foot surgery right -Progressing as expected post-operatively. -XR: As above -WB Status: May begin partial protected WB -Sutures: Nonabsorbable was removed today -May begin bathing  Return in about 2 weeks (around 08/07/2020) for take x-rays POV 2.

## 2020-07-30 ENCOUNTER — Encounter: Payer: Self-pay | Admitting: Podiatry

## 2020-08-04 ENCOUNTER — Encounter: Payer: Self-pay | Admitting: Podiatry

## 2020-08-06 MED ORDER — OXYCODONE HCL 5 MG PO TABS: 5.0000 mg | ORAL_TABLET | ORAL | 0 refills | Status: AC | PRN

## 2020-08-07 ENCOUNTER — Ambulatory Visit (INDEPENDENT_AMBULATORY_CARE_PROVIDER_SITE_OTHER): Payer: BC Managed Care – PPO | Admitting: Podiatry

## 2020-08-07 ENCOUNTER — Other Ambulatory Visit: Payer: Self-pay

## 2020-08-07 ENCOUNTER — Ambulatory Visit (INDEPENDENT_AMBULATORY_CARE_PROVIDER_SITE_OTHER): Payer: BC Managed Care – PPO

## 2020-08-07 ENCOUNTER — Encounter: Payer: Self-pay | Admitting: Podiatry

## 2020-08-07 DIAGNOSIS — M21611 Bunion of right foot: Secondary | ICD-10-CM

## 2020-08-07 DIAGNOSIS — M2011 Hallux valgus (acquired), right foot: Secondary | ICD-10-CM

## 2020-08-07 DIAGNOSIS — Z9889 Other specified postprocedural states: Secondary | ICD-10-CM

## 2020-08-11 ENCOUNTER — Encounter: Payer: Self-pay | Admitting: Podiatry

## 2020-08-11 NOTE — Progress Notes (Signed)
  Subjective:  Patient ID: Dillon Park, male    DOB: [birth year],  MRN: [id number]  Chief Complaint  Patient presents with  . Routine Post Op      (xray)POV #3 DOS 07/12/2020 BUNIONECTOMY (LAPIDUS PROCEDURE) OF THE RT FOOT W/BONE GRAFT FROM HEEL BONE    DOS: 07/12/2020 Procedure: Lapidus and Akin bunionectomy with autograft calcaneus right foot  25 y.o. male returns for post-op check.  Continues to improve, the bruised area on the bottom of the plantar lateral foot still been pretty painful for him  Review of Systems: Negative except as noted in the HPI. Denies N/V/F/Ch.   Objective:   There were no vitals filed for this visit. There is no height or weight on file to calculate BMI. Constitutional Well developed. Well nourished.  Vascular Foot warm and well perfused. Capillary refill normal to all digits.   Neurologic Normal speech. Oriented to person, place, and time. Epicritic sensation to light touch grossly present bilaterally.  Dermatologic  incisions well coapted.  Moderate edema.  Erythematous rash is improving, bruising on bottom of foot is improving too  Orthopedic: Tenderness to palpation noted about the surgical site.   Radiographs: Hardware intact and in good correction with consistency from previous postop films, there is early bridging of the arthrodesis site Assessment:   1. Hallux valgus with bunions, right   2. S/P foot surgery, right    Plan:  Patient was evaluated and treated and all questions answered.  S/p foot surgery right -Progressing as expected post-operatively. -XR: As above -WB Status: Continue protected weightbearing in the CAM boot -May slowly transition to shoes as tolerated in 2 weeks -Expect to be full weightbearing in 4 weeks  No follow-ups on file.

## 2020-08-20 ENCOUNTER — Encounter: Payer: Self-pay | Admitting: Podiatry

## 2020-08-26 MED ORDER — SANTYL 250 UNIT/GM EX OINT: 1.0000 "application " | TOPICAL_OINTMENT | Freq: Every day | CUTANEOUS | 0 refills | Status: AC

## 2020-08-26 MED ORDER — IBUPROFEN 600 MG PO TABS: 600.0000 mg | ORAL_TABLET | Freq: Four times a day (QID) | ORAL | 0 refills | Status: AC | PRN

## 2020-08-28 ENCOUNTER — Other Ambulatory Visit: Payer: Self-pay | Admitting: Podiatry

## 2020-08-28 NOTE — Telephone Encounter (Signed)
Please advise 

## 2020-09-04 ENCOUNTER — Other Ambulatory Visit: Payer: Self-pay

## 2020-09-04 ENCOUNTER — Ambulatory Visit (INDEPENDENT_AMBULATORY_CARE_PROVIDER_SITE_OTHER): Payer: BC Managed Care – PPO | Admitting: Podiatry

## 2020-09-04 ENCOUNTER — Encounter: Payer: Self-pay | Admitting: Podiatry

## 2020-09-04 ENCOUNTER — Ambulatory Visit (INDEPENDENT_AMBULATORY_CARE_PROVIDER_SITE_OTHER): Payer: BC Managed Care – PPO

## 2020-09-04 DIAGNOSIS — M2011 Hallux valgus (acquired), right foot: Secondary | ICD-10-CM

## 2020-09-04 DIAGNOSIS — Z9889 Other specified postprocedural states: Secondary | ICD-10-CM

## 2020-09-04 DIAGNOSIS — M21611 Bunion of right foot: Secondary | ICD-10-CM

## 2020-09-04 NOTE — Progress Notes (Signed)
  Subjective:  Patient ID: Dillon Park, male    DOB: 09/23/1995,  MRN: [id number]  Chief Complaint  Patient presents with   Routine Post Op    DOS 07/12/2020 BUNIONECTOMY (LAPIDUS PROCEDURE) OF THE RT FOOT W/BONE GRAFT FROM HEEL BONE  "its better but I still have a lot of swelling and part of the wound will not heal."     DOS: 07/12/2020 Procedure: Lapidus and Akin bunionectomy with autograft calcaneus right foot  25 y.o. male returns for post-op check.  Bruised area on bottom of foot doing much better. Overall improved. Has been WBAT in the boot.   Review of Systems: Negative except as noted in the HPI. Denies N/V/F/Ch.   Objective:   There were no vitals filed for this visit. There is no height or weight on file to calculate BMI. Constitutional Well developed. Well nourished.  Vascular Foot warm and well perfused. Capillary refill normal to all digits.   Neurologic Normal speech. Oriented to person, place, and time. Epicritic sensation to light touch grossly present bilaterally.  Dermatologic  Incisions well healed there a small area of scab proximally.   Orthopedic: Tenderness to palpation noted about the surgical site. Mild edema   Radiographs: Hardware intact and in good correction with consistency from previous postop films, appears to have good consolidation of the arthrodesis site Assessment:   1. Hallux valgus with bunions, right   2. S/P foot surgery, right    Plan:  Patient was evaluated and treated and all questions answered.  S/p foot surgery right -Progressing as expected post-operatively. -XR: As above -WB Status: full WB, can transition to regular shoes as tolerated -Avoid impact exercise for another 1-2 months -Can go back to work -I think most of his soft tissue issues have been reaction to absorbable suture materials, plan to use prolene and undyed sutures for future surgery   Return in about 2 months (around 11/04/2020).

## 2020-09-25 DIAGNOSIS — M25571 Pain in right ankle and joints of right foot: Secondary | ICD-10-CM | POA: Diagnosis not present

## 2020-09-26 DIAGNOSIS — M25571 Pain in right ankle and joints of right foot: Secondary | ICD-10-CM | POA: Diagnosis not present

## 2020-10-01 DIAGNOSIS — M25571 Pain in right ankle and joints of right foot: Secondary | ICD-10-CM | POA: Diagnosis not present

## 2020-10-03 DIAGNOSIS — M25571 Pain in right ankle and joints of right foot: Secondary | ICD-10-CM | POA: Diagnosis not present

## 2020-10-08 DIAGNOSIS — M25571 Pain in right ankle and joints of right foot: Secondary | ICD-10-CM | POA: Diagnosis not present

## 2020-10-10 DIAGNOSIS — M25571 Pain in right ankle and joints of right foot: Secondary | ICD-10-CM | POA: Diagnosis not present

## 2020-10-16 DIAGNOSIS — M25571 Pain in right ankle and joints of right foot: Secondary | ICD-10-CM | POA: Diagnosis not present

## 2020-10-22 DIAGNOSIS — M25571 Pain in right ankle and joints of right foot: Secondary | ICD-10-CM | POA: Diagnosis not present

## 2020-10-24 DIAGNOSIS — M25571 Pain in right ankle and joints of right foot: Secondary | ICD-10-CM | POA: Diagnosis not present

## 2020-10-29 DIAGNOSIS — M25571 Pain in right ankle and joints of right foot: Secondary | ICD-10-CM | POA: Diagnosis not present

## 2020-10-31 DIAGNOSIS — M25571 Pain in right ankle and joints of right foot: Secondary | ICD-10-CM | POA: Diagnosis not present

## 2020-11-05 DIAGNOSIS — M25571 Pain in right ankle and joints of right foot: Secondary | ICD-10-CM | POA: Diagnosis not present

## 2020-11-06 ENCOUNTER — Ambulatory Visit (INDEPENDENT_AMBULATORY_CARE_PROVIDER_SITE_OTHER): Payer: BC Managed Care – PPO | Admitting: Podiatry

## 2020-11-06 ENCOUNTER — Other Ambulatory Visit: Payer: Self-pay

## 2020-11-06 ENCOUNTER — Ambulatory Visit (INDEPENDENT_AMBULATORY_CARE_PROVIDER_SITE_OTHER): Payer: BC Managed Care – PPO

## 2020-11-06 DIAGNOSIS — M2011 Hallux valgus (acquired), right foot: Secondary | ICD-10-CM

## 2020-11-06 DIAGNOSIS — M21611 Bunion of right foot: Secondary | ICD-10-CM | POA: Diagnosis not present

## 2020-11-07 DIAGNOSIS — M25571 Pain in right ankle and joints of right foot: Secondary | ICD-10-CM | POA: Diagnosis not present

## 2020-11-11 NOTE — Progress Notes (Signed)
  Subjective:  Patient ID: Dillon Park, male    DOB: [birth year],  MRN: [id number]  Chief Complaint  Patient presents with   Bunions    Status post right foot bunion correction DOS  07/12/20     DOS: 07/12/2020 Procedure: Conception Chancy and Akin bunionectomy with autograft calcaneus right foot  25 y.o. male returns for post-op check.  Overall doing well has been able to ice skate and prep for hockey season, his conditioning and range of motion has improved and he is still doing some therapy they are mostly working on the scar  Review of Systems: Negative except as noted in the HPI. Denies N/V/F/Ch.   Objective:   There were no vitals filed for this visit. There is no height or weight on file to calculate BMI. Constitutional Well developed. Well nourished.  Vascular Foot warm and well perfused. Capillary refill normal to all digits.   Neurologic Normal speech. Oriented to person, place, and time. Epicritic sensation to light touch grossly present bilaterally.  Dermatologic Scar is hypertrophic and tender to touch  Orthopedic: Minimal edema, good range of motion of the MTPJ   Radiographs: Hardware intact and in good correction with consistency from previous postop films, good fusion of the arthrodesis site noted Assessment:   1. Hallux valgus with bunions, right    Plan:  Patient was evaluated and treated and all questions answered.  S/p foot surgery right -Overall doing well he will finish therapy and resume his regular activities and return to hockey.  He will let me know if and when he is ready to do his left foot -I think most of his soft tissue issues have been reaction to absorbable suture materials, plan to use prolene and undyed sutures for future surgery   Return if symptoms worsen or fail to improve.

## 2020-11-12 DIAGNOSIS — M25571 Pain in right ankle and joints of right foot: Secondary | ICD-10-CM | POA: Diagnosis not present

## 2020-11-26 DIAGNOSIS — M25571 Pain in right ankle and joints of right foot: Secondary | ICD-10-CM | POA: Diagnosis not present

## 2020-11-28 DIAGNOSIS — M25571 Pain in right ankle and joints of right foot: Secondary | ICD-10-CM | POA: Diagnosis not present

## 2021-01-06 ENCOUNTER — Other Ambulatory Visit: Payer: Self-pay

## 2021-01-06 ENCOUNTER — Telehealth: Payer: Self-pay | Admitting: *Deleted

## 2021-01-06 ENCOUNTER — Emergency Department
Admission: EM | Admit: 2021-01-06 | Discharge: 2021-01-07 | Disposition: A | Discharge status: Home / Self Care | Payer: BC Managed Care – PPO | Attending: Emergency Medicine | Admitting: Emergency Medicine

## 2021-01-06 ENCOUNTER — Ambulatory Visit (INDEPENDENT_AMBULATORY_CARE_PROVIDER_SITE_OTHER): Payer: BC Managed Care – PPO | Admitting: Podiatry

## 2021-01-06 ENCOUNTER — Encounter: Payer: Self-pay | Admitting: Podiatry

## 2021-01-06 DIAGNOSIS — R55 Syncope and collapse: Secondary | ICD-10-CM | POA: Diagnosis not present

## 2021-01-06 DIAGNOSIS — R61 Generalized hyperhidrosis: Secondary | ICD-10-CM | POA: Diagnosis not present

## 2021-01-06 DIAGNOSIS — M2011 Hallux valgus (acquired), right foot: Secondary | ICD-10-CM

## 2021-01-06 DIAGNOSIS — Z8616 Personal history of COVID-19: Secondary | ICD-10-CM | POA: Diagnosis not present

## 2021-01-06 DIAGNOSIS — R112 Nausea with vomiting, unspecified: Secondary | ICD-10-CM | POA: Insufficient documentation

## 2021-01-06 DIAGNOSIS — L910 Hypertrophic scar: Secondary | ICD-10-CM

## 2021-01-06 DIAGNOSIS — M21611 Bunion of right foot: Secondary | ICD-10-CM

## 2021-01-06 DIAGNOSIS — R42 Dizziness and giddiness: Secondary | ICD-10-CM | POA: Insufficient documentation

## 2021-01-06 LAB — BASIC METABOLIC PANEL
Anion gap: 7 (ref 5–15)
BUN: 13 mg/dL (ref 6–20)
CO2: 28 mmol/L (ref 22–32)
Calcium: 10 mg/dL (ref 8.9–10.3)
Chloride: 102 mmol/L (ref 98–111)
Creatinine, Ser: 1 mg/dL (ref 0.61–1.24)
GFR, Estimated: >60 mL/min (ref >60)
Glucose, Bld: 115 mg/dL — ABNORMAL HIGH (ref 70–99)
Potassium: 4 mmol/L (ref 3.5–5.1)
Sodium: 137 mmol/L (ref 135–145)

## 2021-01-06 LAB — CBC
HCT: 42.3 % (ref 39.0–52.0)
Hemoglobin: 15.7 g/dL (ref 13.0–17.0)
MCH: 31.9 pg (ref 26.0–34.0)
MCHC: 37.1 g/dL — ABNORMAL HIGH (ref 30.0–36.0)
MCV: 86 fL (ref 80.0–100.0)
Platelets: 271 10*3/uL (ref 150–400)
RBC: 4.92 MIL/uL (ref 4.22–5.81)
RDW: 12.2 % (ref 11.5–15.5)
WBC: 16 10*3/uL — ABNORMAL HIGH (ref 4.0–10.5)
nRBC: 0 % (ref 0.0–0.2)

## 2021-01-06 MED ORDER — SODIUM CHLORIDE 0.9 % IV SOLN
12.5000 mg | Freq: Four times a day (QID) | INTRAVENOUS | Status: DC | PRN
  Administered 2021-01-06: 12.5 mg via INTRAVENOUS
  Filled 2021-01-06: qty 12.5

## 2021-01-06 MED ORDER — PROMETHAZINE HCL 12.5 MG PO TABS: 12.5000 mg | ORAL_TABLET | Freq: Four times a day (QID) | ORAL | 0 refills | Status: DC | PRN

## 2021-01-06 MED ORDER — SODIUM CHLORIDE 0.9 % IV BOLUS
1000.0000 mL | Freq: Once | INTRAVENOUS | Status: AC
  Administered 2021-01-06: 1000 mL via INTRAVENOUS

## 2021-01-06 MED ORDER — ONDANSETRON HCL 4 MG PO TABS
4.0000 mg | ORAL_TABLET | Freq: Once | ORAL | Status: AC
  Administered 2021-01-06: 4 mg via ORAL
  Filled 2021-01-06: qty 1

## 2021-01-06 NOTE — ED Provider Notes (Signed)
[facility] EMERGENCY DEPARTMENT Provider Note   CSN: [id number] Arrival date & time: 01/06/21  1751     History Chief Complaint  Patient presents with   Dizziness    Dillon Park is a 25 y.o. male presents to the emergency department for evaluation dizziness nausea and vomiting after receiving an steroid injection into his foot.  Patient underwent bunion surgery and developed a significant amount of scar tissue.  He was receiving a cortisone injection into the scar tissue today at the podiatrist office when he started to become diaphoretic, lightheaded and eventually blacked out.  He was reclined back in a chair.  He states he was out for short amount of time and that he came to and he felt nauseous.  He suffered a few episodes of vomiting.  He has had some mild dizziness.  He was told to go straight to the ER due to his symptoms.  Prior to receiving his injection he was feeling fine.  No chest pain or shortness of breath.  Denies any episodes of chest pain or shortness of breath during the injection and diaphoresis.  He denies any abdominal pain.  He was given Zofran in triage and did see some improvement of nausea and vomiting.  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 No pertinent surgical history.     No family history on file.  Social History   Tobacco Use   Smoking status: Never   Smokeless tobacco: Never  Substance Use Topics   Alcohol use: Yes    Comment: occasionally   Drug use: No    Home Medications Prior to Admission  medications   Medication Sig Start Date End Date Taking? Authorizing Provider  promethazine (PHENERGAN) 12.5 MG tablet Take 1 tablet (12.5 mg total) by mouth every 6 (six) hours as needed for nausea or vomiting. 01/06/21  Yes Evon Slack, PA-C  cephALEXin (KEFLEX) 500 MG capsule Take 1 capsule (500 mg total) by mouth 3 (three) times daily. 07/17/20   McDonald, Rachelle Hora, DPM  gabapentin (NEURONTIN) 300 MG capsule Take 1 capsule (300 mg total) by mouth 3 (three) times daily for 7 days. 07/12/20 07/19/20  Edwin Cap, DPM    Allergies    Patient has no known allergies.  Review of Systems   Review of Systems  Constitutional:  Negative for chills and fever.  Respiratory:  Negative for cough and shortness of breath.   Cardiovascular:  Negative for chest pain.  Gastrointestinal:  Positive for nausea and vomiting. Negative for abdominal distention and abdominal pain.  Genitourinary:  Negative for dysuria.  Musculoskeletal:  Negative for arthralgias.  Skin:  Negative for rash and wound.   Physical Exam Updated Vital Signs BP 122/76 (BP Location: Right Arm)   Pulse 88   Temp 97.7 F (36.5 C) (Oral)   Resp 18   Ht 5\' 8"  (1.727 m)   Wt 77.1 kg   SpO2 99%   BMI 25.84 kg/m   Physical Exam Constitutional:  Appearance: He is well-developed and normal weight.  HENT:     Head: Normocephalic and atraumatic.     Right Ear: Ear canal and external ear normal.     Left Ear: Ear canal and external ear normal.     Mouth/Throat:     Mouth: Mucous membranes are moist.  Eyes:     Conjunctiva/sclera: Conjunctivae normal.     Pupils: Pupils are equal, round, and reactive to light.  Cardiovascular:     Rate and Rhythm: Normal rate and regular rhythm.     Pulses: Normal pulses.     Heart sounds: Normal heart sounds. No murmur heard.   No friction rub. No gallop.  Pulmonary:     Effort: Pulmonary effort is normal. No respiratory distress.  Abdominal:     General: Abdomen is flat. There  is no distension.     Tenderness: There is no abdominal tenderness.  Musculoskeletal:        General: Normal range of motion.     Cervical back: Normal range of motion.  Skin:    General: Skin is warm.     Findings: No rash.  Neurological:     Mental Status: He is alert and oriented to person, place, and time.  Psychiatric:        Behavior: Behavior normal.        Thought Content: Thought content normal.    ED Results / Procedures / Treatments   Labs (all labs ordered are listed, but only abnormal results are displayed) Labs Reviewed  BASIC METABOLIC PANEL - Abnormal; Notable for the following components:      Result Value   Glucose, Bld 115 (*)    All other components within normal limits  CBC - Abnormal; Notable for the following components:   WBC 16.0 (*)    MCHC 37.1 (*)    All other components within normal limits  URINALYSIS, COMPLETE (UACMP) WITH MICROSCOPIC  CBG MONITORING, ED    EKG None  Radiology No results found.  Procedures Procedures   Medications Ordered in ED Medications  promethazine (PHENERGAN) 12.5 mg in sodium chloride 0.9 % 50 mL IVPB (0 mg Intravenous Stopped 01/06/21 2155)  ondansetron (ZOFRAN) tablet 4 mg (4 mg Oral Given 01/06/21 1847)  sodium chloride 0.9 % bolus 1,000 mL (0 mLs Intravenous Stopped 01/06/21 2155)    ED Course  I have reviewed the triage vital signs and the nursing notes.  Pertinent labs & imaging results that were available during my care of the patient were reviewed by me and considered in my medical decision making (see chart for details).    MDM Rules/Calculators/A&P                         25 year old male with vasovagal episode after receiving a cortisone injection into his foot to break down scar tissue.  Patient became diaphoretic while receiving the injection and passed out.  He came to quickly.  No head injury or falls.  He has remained dizzy, nauseous with a few episodes of vomiting.  No chest pain or  shortness of breath.  After IV nausea medication and fluids patient's symptoms have resolved.  He feels well and is asymptomatic.  He is able to tolerate p.o. fluids and foods well with no nausea or vomiting or abdominal pain.  Patient's vital signs are stable.  He is stable and ready for discharge to home.  He understands signs and symptoms return to the ER  for. Final Clinical Impression(s) / ED Diagnoses Final diagnoses:  Vasovagal episode  Nausea and vomiting, unspecified vomiting type    Rx / DC Orders ED Discharge Orders          Ordered    promethazine (PHENERGAN) 12.5 MG tablet  Every 6 hours PRN        01/06/21 2157             Ronnette Juniper 01/06/21 2158    Minna Antis, MD 01/06/21 2216

## 2021-01-06 NOTE — Discharge Instructions (Addendum)
Make sure you are drinking fluids.  Take Phenergan as needed for any nausea or vomiting.  If continued dizziness, lightheadedness please return to the ER.

## 2021-01-06 NOTE — ED Provider Notes (Signed)
**Note Dillon Park** Emergency Medicine Provider Triage Evaluation Note  Dillon Park , a 25 y.o. male  was evaluated in triage.  Pt complains of dizziness, nausea and vomiting after patient had a steroid injection in his foot.  Patient states that he passed out while in the podiatrist office and was unable to finish injection. Patient states that his blood pressure and nausea/vomiting did not improve so he was referred to the emergency department.  Patient actively vomiting in triage.  Patient states that he has never had a similar response in the past.  He denies chest pain, chest tightness or shortness of breath.  No rash.  Review of Systems  Positive: Patient has nausea, vomiting and dizziness.  Negative: No chest pain, chest tightness or shortness of breath.  Physical Exam  BP 107/66 (BP Location: Left Arm)   Pulse 87   Temp 97.7 F (36.5 C) (Oral)   Resp 20   Ht 5\' 8"  (1.727 m)   Wt 77.1 kg   SpO2 99%   BMI 25.84 kg/m  Gen:   Awake, no distress   Resp:  Normal effort  MSK:   Moves extremities without difficulty  Other:  Patient has vomiting.   Medical Decision Making  Medically screening exam initiated at 6:53 PM.  Appropriate orders placed.  GIBRAN VESELKA was informed that the remainder of the evaluation will be completed by another provider, this initial triage assessment does not replace that evaluation, and the importance of remaining in the ED until their evaluation is complete.     Sandria Bales [place], PA-C 01/06/21 01/08/21    Vinnie Langton, MD 01/06/21 2133

## 2021-01-06 NOTE — ED Triage Notes (Signed)
Pt come with c/o dizziness and nausea. Pt states he had an injection of steroid in right foot today. Pt states he had syncopal episode and vague down. Pt states since he has been weak and nauseated.

## 2021-01-06 NOTE — Telephone Encounter (Signed)
I am calling you to let you know that we are seeing [facility].  He was given an injection.  He almost fainted.  His blood pressure dropped.  He's clammy and he's nauseated.  His blood pressure is slowly coming back up but he's still experiencing nausea. He may need you to come and pick him up.  Dr. Lilian Kapur said he may have to go to the ER and receive IV fluids and to get something for nausea if he doesn't improve by 5:15pm.  "Okay, I'll head that way."

## 2021-01-07 NOTE — Progress Notes (Signed)
Subjective:  Patient ID: Dillon Park, male    DOB: Feb 10, 1996,  MRN: [id number]  Chief Complaint  Patient presents with   Routine Post Op    "It's pretty good, a little soreness after hiking.  The scar tissue hasn't gone away in that one spot."     DOS: 07/12/2020 Procedure: Lapidus and Akin bunionectomy with autograft calcaneus right foot  25 y.o. male returns for post-op check.  Overall doing well but somewhat sore when he goes hiking and plays hockey the scar is the most painful part.  Review of Systems: Negative except as noted in the HPI. Denies N/V/F/Ch.   Objective:   There were no vitals filed for this visit. There is no height or weight on file to calculate BMI. Constitutional Well developed. Well nourished.  Vascular Foot warm and well perfused. Capillary refill normal to all digits.   Neurologic Normal speech. Oriented to person, place, and time. Epicritic sensation to light touch grossly present bilaterally.  Dermatologic Scar remains hypertrophic and tender to touch  Orthopedic: No edema, good range of motion of the MTPJ.  He has recurrence of bunion deformity   Radiographs: Hardware intact and in good correction with consistency from previous postop films, good fusion of the arthrodesis site noted, he has some recurrence of his bunion but it is better than it was prior to surgery Assessment:   1. Hypertrophic scar   2. Hallux valgus with bunions, right    Plan:  Patient was evaluated and treated and all questions answered.  S/p foot surgery right -We discussed that he does have some recurrence of his hallux valgus, I discussed with him I likely think that it was undercorrected because of the degree of difficulty with the amount of deformity he had.  It is better than it was prior to surgery and clinically is not painful and not bother him like it was before.  About the same as his other foot which is a less severe bunion.  Regarding his hypertrophic scar  I do think this is likely a reaction from absorbable sutures.  Any further procedures in the future should use Prolene or nylon sutures that would be less reactive.  I recommend intralesional corticosteroid injection today.  I performed a local field block with 3 cc of lidocaine.  I then injected the lesion and the entire length of the scar in several places using 20 mg of Kenalog and 4 mg of dexamethasone with a 30-gauge needle.  Unfortunate towards the end of the injection (I was able to complete about 75% of it), he suffered a vasovagal response and briefly lost consciousness.  I elevated his legs put cold compress on his head and he was quite diaphoretic and sweating.  He regained consciousness immediately.  He did not hit his head or injure himself otherwise.  Was quite nauseous and vomited several times of his stomach contents.  This did not improve much over approximately 40 minutes and his mother came to pick him up and took him to the emergency room for evaluation and treatment for the nausea and vomiting.  He was initially hypotensive with his blood pressure at 104/59 after the incident, this improved to 123/68 and then 138/84 in 10 and 20-minute increments respectively.  I called him the day after to see how he is feeling but did not get an answer left a voicemail to let me know how he is doing with the scar and I likely would not expect to try to  more any injections further at this point.  We could consider surgical excision of the scar at some point if it does not improve   No follow-ups on file.

## 2021-06-16 ENCOUNTER — Other Ambulatory Visit: Payer: Self-pay

## 2021-06-16 ENCOUNTER — Ambulatory Visit
Admission: EM | Admit: 2021-06-16 | Discharge: 2021-06-16 | Disposition: A | Discharge status: Home / Self Care | Payer: BC Managed Care – PPO | Attending: Family Medicine | Admitting: Family Medicine

## 2021-06-16 ENCOUNTER — Encounter: Payer: Self-pay | Admitting: Emergency Medicine

## 2021-06-16 DIAGNOSIS — L249 Irritant contact dermatitis, unspecified cause: Secondary | ICD-10-CM | POA: Diagnosis not present

## 2021-06-16 MED ORDER — PREDNISONE 10 MG (48) PO TBPK: ORAL_TABLET | ORAL | 0 refills | Status: DC

## 2021-06-16 NOTE — ED Triage Notes (Signed)
Pt here with painful, itchy rash to the back of both thighs since Friday after sitting on a couch with cat fur on it. Has known allergy to cats. Tried to take OTC meds, but did not have much success.  ?

## 2021-06-18 NOTE — ED Provider Notes (Signed)
?  [facility] ? ? ?[id number] ?06/16/21 Arrival Time: 1111 ? ?ASSESSMENT & PLAN: ? ?1. Irritant contact dermatitis, unspecified trigger   ? ?Begin: ?Meds ordered this encounter  ?Medications  ? predniSONE (STERAPRED UNI-PAK 48 TAB) 10 MG (48) TBPK tablet  ?  Sig: Take as directed.  ?  Dispense:  48 tablet  ?  Refill:  0  ? ?No signs of skin infection or zoster. ? ?Will follow up with PCP or here if worsening or failing to improve as anticipated. ?Reviewed expectations re: course of current medical issues. Questions answered. ?Outlined signs and symptoms indicating need for more acute intervention. ?Patient verbalized understanding. ?After Visit Summary given. ? ? ?SUBJECTIVE: ? ?Dillon Park is a 26 y.o. male who presents with a skin complaint. Rash; itchy; back of post thighs; sev days. Noted after sitting on couch in home with cats. No tx PTA. ? ?OBJECTIVE: ?Vitals:  ? 06/16/21 1127  ?BP: 131/83  ?Pulse: 73  ?Resp: 18  ?Temp: 98.8 ?F (37.1 ?C)  ?TempSrc: Oral  ?SpO2: 97%  ?  ?General appearance: alert; no distress ?HEENT: [place]; AT ?Neck: supple with FROM ?Extremities: no edema; moves all extremities normally ?Skin: warm and dry; signs of infection: no; post thighs with linear areas of erythema and scratch marks/excoriations; no bleeding ?Psychological: alert and cooperative; normal mood and affect ? ?No Known Allergies ? ?Past Medical History:  ?Diagnosis Date  ? Concussion 2009  ? x2 (football/baseball)  ? COVID-19 virus infection 09/2019  ? Strep throat   ? Zoster 11/2009  ? ?Social History  ? ?Socioeconomic History  ? Marital status: Single  ?  Spouse name: Not on file  ? Number of children: Not on file  ? Years of education: Not on file  ? Highest education level: Not on file  ?Occupational History  ? Not on file  ?Tobacco Use  ? Smoking status: Never  ? Smokeless tobacco: Never  ?Substance and Sexual Activity  ? Alcohol use: Yes  ?  Comment: occasionally  ? Drug use: No  ? Sexual activity: Not on file   ?Other Topics Concern  ? Not on file  ?Social History Narrative  ? Not on file  ? ?Social Determinants of Health  ? ?Financial Resource Strain: Not on file  ?Food Insecurity: Not on file  ?Transportation Needs: Not on file  ?Physical Activity: Not on file  ?Stress: Not on file  ?Social Connections: Not on file  ?Intimate Partner Violence: Not on file  ? ?History reviewed. No pertinent family history. ?History reviewed. No pertinent surgical history. ? ?  ?Mardella Layman, MD ?06/18/21 1008 ? ?

## 2022-01-30 ENCOUNTER — Encounter: Payer: Self-pay | Admitting: Nurse Practitioner

## 2022-01-30 ENCOUNTER — Ambulatory Visit: Payer: BC Managed Care – PPO | Admitting: Nurse Practitioner

## 2022-01-30 VITALS — BP 100/62 | HR 76 | Temp 96.3°F | Resp 12 | Ht 68.0 in | Wt 163.0 lb

## 2022-01-30 DIAGNOSIS — J302 Other seasonal allergic rhinitis: Secondary | ICD-10-CM | POA: Insufficient documentation

## 2022-01-30 DIAGNOSIS — Z23 Encounter for immunization: Secondary | ICD-10-CM

## 2022-01-30 DIAGNOSIS — J301 Allergic rhinitis due to pollen: Secondary | ICD-10-CM

## 2022-01-30 DIAGNOSIS — R5383 Other fatigue: Secondary | ICD-10-CM

## 2022-01-30 DIAGNOSIS — Z0000 Encounter for general adult medical examination without abnormal findings: Secondary | ICD-10-CM

## 2022-01-30 DIAGNOSIS — Z13220 Encounter for screening for lipoid disorders: Secondary | ICD-10-CM

## 2022-01-30 LAB — COMPREHENSIVE METABOLIC PANEL
ALT: 45 U/L (ref 0–53)
AST: 21 U/L (ref 0–37)
Albumin: 4.8 g/dL (ref 3.5–5.2)
Alkaline Phosphatase: 64 U/L (ref 39–117)
BUN: 15 mg/dL (ref 6–23)
CO2: 33 mEq/L — ABNORMAL HIGH (ref 19–32)
Calcium: 10.2 mg/dL (ref 8.4–10.5)
Chloride: 104 mEq/L (ref 96–112)
Creatinine, Ser: 1 mg/dL (ref 0.40–1.50)
GFR: 104.31 mL/min (ref >60.00)
Glucose, Bld: 79 mg/dL (ref 70–99)
Potassium: 4.6 mEq/L (ref 3.5–5.1)
Sodium: 141 mEq/L (ref 135–145)
Total Bilirubin: 0.5 mg/dL (ref 0.2–1.2)
Total Protein: 7.1 g/dL (ref 6.0–8.3)

## 2022-01-30 LAB — CBC
HCT: 44.6 % (ref 39.0–52.0)
Hemoglobin: 15.4 g/dL (ref 13.0–17.0)
MCHC: 34.6 g/dL (ref 30.0–36.0)
MCV: 89.5 fl (ref 78.0–100.0)
Platelets: 257 10*3/uL (ref 150.0–400.0)
RBC: 4.98 Mil/uL (ref 4.22–5.81)
RDW: 13.2 % (ref 11.5–15.5)
WBC: 5.6 10*3/uL (ref 4.0–10.5)

## 2022-01-30 LAB — TSH: TSH: 0.9 u[IU]/mL (ref 0.35–5.50)

## 2022-01-30 LAB — LIPID PANEL
Cholesterol: 197 mg/dL (ref 0–200)
HDL: 46.3 mg/dL (ref >39.00)
LDL Cholesterol: 127 mg/dL — ABNORMAL HIGH (ref 0–99)
NonHDL: 151.1
Total CHOL/HDL Ratio: 4
Triglycerides: 121 mg/dL (ref 0.0–149.0)
VLDL: 24.2 mg/dL (ref 0.0–40.0)

## 2022-01-30 MED ORDER — FLUTICASONE PROPIONATE 50 MCG/ACT NA SUSP: 2.0000 | Freq: Every day | NASAL | 0 refills | Status: DC

## 2022-01-30 MED ORDER — LEVOCETIRIZINE DIHYDROCHLORIDE 5 MG PO TABS: 5.0000 mg | ORAL_TABLET | Freq: Every evening | ORAL | 0 refills | Status: DC

## 2022-01-30 NOTE — Assessment & Plan Note (Signed)
Patient has tried a myriad of options.  We will put him on a second-generation antihistamine and fluticasone.  This is ineffective would consider adding on montelukast.  If this is ineffective we can send him to ENT for further evaluation.  Discussed this with patient

## 2022-01-30 NOTE — Assessment & Plan Note (Signed)
2/2 above second-generation antihistamine and fluticasone nasal spray

## 2022-01-30 NOTE — Progress Notes (Signed)
New Patient Office Visit  Subjective    Patient ID: Dillon Park, male    DOB: 1995-12-27  Age: 26 y.o. MRN: [id number]  CC:  Chief Complaint  Patient presents with   Establish Care    No previous PCP in a while   Allergic Rhinitis     Would like to discuss referral to allergist or any other recommendations. He has tried several OTC products and the only thing that helps some is Benadryl.    HPI ROI Dillon Park presents to establish care   Sinus stuff: States that it has been bothering him 10 years. States that he use to get strep throat twice a year. In high school seasonal allergies that are getting worse. Has tried allergra,claratian, xyrtec, benadryl. Benadryl helps but causes sedatoin. States it startes with a sore throat, mucouse, cough, stuffy and runny nose. Every other month. States that he has seen allergy panel for environmental   for complete physical and follow up of chronic conditions.  Immunizations: -Tetanus: up today -Influenza: refused -Shingles: Too young -Pneumonia: Too young -[place] x2   -HPV: Discussed in office  Diet: Fair diet. Small breakfast (granola), lunch is usually small like a sandwihc, will have a larger dinner. Cooking and eating out. Avoid fried food. Water, maybe sweet tea when eating out  Exercise: No regular exercise. 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 Will go to bed around 12 and gets up around 830-9a. Feels rested sometimes. Does snore some.  Patient has no concerns for sexually transmitted infections.     Outpatient Encounter Medications as of 01/30/2022  Medication Sig   fluticasone (FLONASE) 50 MCG/ACT nasal spray Place 2 sprays into both nostrils daily.   levocetirizine (XYZAL) 5 MG tablet Take 1 tablet (5 mg  total) by mouth every evening.   Multiple Vitamin (MULTIVITAMIN) capsule Take 1 capsule by mouth daily.   promethazine (PHENERGAN) 12.5 MG tablet Take 1 tablet (12.5 mg total) by mouth every 6 (six) hours as needed for nausea or vomiting. (Patient not taking: Reported on 01/30/2022)   [DISCONTINUED] cephALEXin (KEFLEX) 500 MG capsule Take 1 capsule (500 mg total) by mouth 3 (three) times daily.   [DISCONTINUED] gabapentin (NEURONTIN) 300 MG capsule Take 1 capsule (300 mg total) by mouth 3 (three) times daily for 7 days.   [DISCONTINUED] predniSONE (STERAPRED UNI-PAK 48 TAB) 10 MG (48) TBPK tablet Take as directed.   No facility-administered encounter medications on file as of 01/30/2022.    Past Medical History:  Diagnosis Date   Allergy 2020   I have had seasonal allergies since I was early teens   Anxiety 2023   This is something I have had the last few years   Concussion 2009   x2 (football/baseball)   COVID-19 virus infection 09/2019   GERD (gastroesophageal reflux disease) 2019   Very rare, but happens on occasion   Zoster 11/2009    Past Surgical History:  Procedure Laterality Date   BUNIONECTOMY Right 2022   WISDOM TOOTH EXTRACTION      Family History  Problem Relation Age of Onset   Healthy Mother    Migraines Mother    Diabetes Father    Kidney Stones Father    Healthy Brother     Social History   Socioeconomic History  Marital status: Single    Spouse name: Not on file   Number of children: 0   Years of education: Some college   Highest education level: Not on file  Occupational History   Not on file  Tobacco Use   Smoking status: Never   Smokeless tobacco: Never   Tobacco comments:    None  Vaping Use   Vaping Use: Never used  Substance and Sexual Activity   Alcohol use: Yes    Alcohol/week: 2.0 standard drinks of alcohol    Types: 2 Cans of beer per week    Comment: Just in a social setting with my friends I play hockey with   Drug use: No    Sexual activity: Never  Other Topics Concern   Not on file  Social History Narrative   Warehouse work and sports broadcasting      Hobbies: hockey playing and watching hike and disc golf    Social Determinants of Radio broadcast assistant Strain: Not on file  Food Insecurity: Not on file  Transportation Needs: Not on file  Physical Activity: Not on file  Stress: Not on file  Social Connections: Not on file  Intimate Partner Violence: Not on file    Review of Systems  Constitutional:  Negative for chills and fever.  Respiratory:  Negative for shortness of breath.   Cardiovascular:  Negative for chest pain.  Gastrointestinal:  Negative for abdominal pain, blood in stool, constipation, diarrhea, nausea and vomiting.       BM daily   Genitourinary:  Negative for dysuria and hematuria.  Neurological:  Negative for headaches.  Psychiatric/Behavioral:  Negative for hallucinations and suicidal ideas.         Objective    BP 100/62   Pulse 76   Temp (!) 96.3 F (35.7 C)   Resp 12   Ht _0  (1.727 m)   Wt 163 lb (73.9 kg)   SpO2 98%   BMI 24.78 kg/m   Physical Exam Vitals and nursing note reviewed. Exam conducted with a chaperone present [facility] [place], RMA).  Constitutional:      Appearance: Normal appearance.  HENT:     Right Ear: Tympanic membrane, ear canal and external ear normal.     Left Ear: Tympanic membrane, ear canal and external ear normal.     Nose: Nose normal.     Mouth/Throat:     Mouth: Mucous membranes are moist.     Pharynx: Oropharynx is clear.  Eyes:     Extraocular Movements: Extraocular movements intact.     Pupils: Pupils are equal, round, and reactive to light.  Cardiovascular:     Rate and Rhythm: Normal rate and regular rhythm.     Heart sounds: Normal heart sounds.  Pulmonary:     Effort: Pulmonary effort is normal.     Breath sounds: Normal breath sounds.  Abdominal:     General: Bowel sounds are normal. There is no  distension.     Palpations: There is no mass.     Tenderness: There is no abdominal tenderness.     Hernia: No hernia is present. There is no hernia in the left inguinal area or right inguinal area.  Genitourinary:    Penis: Normal.      Testes: Normal.     Epididymis:     Right: Normal.     Left: Normal.  Musculoskeletal:     Right lower leg: No edema.     Left lower  leg: No edema.  Lymphadenopathy:     Cervical: No cervical adenopathy.     Lower Body: No right inguinal adenopathy. No left inguinal adenopathy.  Skin:    General: Skin is warm.  Neurological:     General: No focal deficit present.     Mental Status: He is alert.     Deep Tendon Reflexes:     Reflex Scores:      Bicep reflexes are 2+ on the right side and 2+ on the left side.      Patellar reflexes are 2+ on the right side and 2+ on the left side.    Comments: Bilateral upper and lower extremity strength 5/5  Psychiatric:        Mood and Affect: Mood normal.        Behavior: Behavior normal.        Thought Content: Thought content normal.        Judgment: Judgment normal.         Assessment & Plan:   Problem List Items Addressed This Visit       Respiratory   Seasonal allergic rhinitis due to pollen    Patient has tried a myriad of options.  We will put him on a second-generation antihistamine and fluticasone.  This is ineffective would consider adding on montelukast.  If this is ineffective we can send him to ENT for further evaluation.  Discussed this with patient      Relevant Medications   levocetirizine (XYZAL) 5 MG tablet   fluticasone (FLONASE) 50 MCG/ACT nasal spray     Other   Preventative health care - Primary    Discussed age-appropriate immunizations and screening exams.  Patient was given information at discharge about preventative healthcare maintenance and anticipatory guidance for folks of his age range.  Too young for most screenings, no concern for sexually transmitted infections.   Update Tdap today.      Relevant Orders   CBC   Lipid panel   Comprehensive metabolic panel   TSH   Fatigue    Pending labs inclusive of TSH      Relevant Orders   TSH   Seasonal allergies    2/2 above second-generation antihistamine and fluticasone nasal spray      Relevant Medications   levocetirizine (XYZAL) 5 MG tablet   fluticasone (FLONASE) 50 MCG/ACT nasal spray   Other Visit Diagnoses     Need for Tdap vaccination       Relevant Orders   Tdap vaccine greater than or equal to 7yo IM (Completed)   Screening for lipid disorders       Relevant Orders   Lipid panel       Return in about 1 year (around 01/31/2023) for CPE and Labs.   Romilda Garret, NP

## 2022-01-30 NOTE — Patient Instructions (Signed)
Nice to see you today I will be in touch with the labs once I have them I have sent in a pill and a nasal spray. If this does not help let me know and I will add a different medication. If that does not help then we will send you to an ear nose and throat doctor.

## 2022-01-30 NOTE — Assessment & Plan Note (Signed)
Pending labs inclusive of TSH

## 2022-01-30 NOTE — Assessment & Plan Note (Signed)
Discussed age-appropriate immunizations and screening exams.  Patient was given information at discharge about preventative healthcare maintenance and anticipatory guidance for folks of his age range.  Too young for most screenings, no concern for sexually transmitted infections.  Update Tdap today.

## 2022-03-01 ENCOUNTER — Other Ambulatory Visit: Payer: Self-pay | Admitting: Nurse Practitioner

## 2022-03-01 DIAGNOSIS — J301 Allergic rhinitis due to pollen: Secondary | ICD-10-CM

## 2022-03-01 DIAGNOSIS — J302 Other seasonal allergic rhinitis: Secondary | ICD-10-CM

## 2022-04-30 ENCOUNTER — Other Ambulatory Visit: Payer: Self-pay | Admitting: Nurse Practitioner

## 2022-04-30 DIAGNOSIS — J302 Other seasonal allergic rhinitis: Secondary | ICD-10-CM

## 2022-04-30 DIAGNOSIS — J301 Allergic rhinitis due to pollen: Secondary | ICD-10-CM

## 2022-06-24 ENCOUNTER — Ambulatory Visit
Admission: EM | Admit: 2022-06-24 | Discharge: 2022-06-24 | Disposition: A | Discharge status: Home / Self Care | Payer: BLUE CROSS/BLUE SHIELD | Attending: Emergency Medicine | Admitting: Emergency Medicine

## 2022-06-24 DIAGNOSIS — L239 Allergic contact dermatitis, unspecified cause: Secondary | ICD-10-CM | POA: Diagnosis not present

## 2022-06-24 MED ORDER — DEXAMETHASONE SODIUM PHOSPHATE 10 MG/ML IJ SOLN
10.0000 mg | Freq: Once | INTRAMUSCULAR | Status: AC
  Administered 2022-06-24: 10 mg via INTRAMUSCULAR

## 2022-06-24 MED ORDER — PREDNISONE 10 MG (21) PO TBPK: ORAL_TABLET | Freq: Every day | ORAL | 0 refills | Status: DC

## 2022-06-24 NOTE — ED Provider Notes (Signed)
Dillon Park    CSN: [id number] Arrival date & time: 06/24/22  1840      History   Chief Complaint Chief Complaint  Patient presents with   Rash    HPI Dillon Park is a 27 y.o. male.  Patient presents with pruritic rash on his face, trunk, extremities since last night.  The rash started after he was petting his girlfriend's cat who had been outside and may have come in contact with new plants.  Treating with Benadryl.  Patient reports no difficulty swallowing or breathing.  He has had other episodes of similar allergic dermatitis over the past year since his girlfriend moved to a new area.  The history is provided by the patient and medical records.    Past Medical History:  Diagnosis Date   Allergy 2020   I have had seasonal allergies since I was early teens   Anxiety 2023   This is something I have had the last few years   Concussion 2009   x2 (football/baseball)   COVID-19 virus infection 09/2019   GERD (gastroesophageal reflux disease) 2019   Very rare, but happens on occasion   Zoster 11/2009    Patient Active Problem List   Diagnosis Date Noted   Preventative health care 01/30/2022   Fatigue 01/30/2022   Seasonal allergies 01/30/2022   Seasonal allergic rhinitis due to pollen 01/30/2022   COVID-19 virus infection 10/13/2019   Contusion of left knee 10/07/2018   Sore throat 01/30/2013   Soft tissue injury of neck 08/12/2012   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 Authorizing Provider  levocetirizine (XYZAL) 5 MG tablet TAKE 1 TABLET BY MOUTH EVERY DAY IN THE EVENING 04/30/22  Yes Michela Pitcher, NP  Multiple Vitamin (MULTIVITAMIN) capsule Take 1 capsule by mouth daily.   Yes [provider]  predniSONE (STERAPRED UNI-PAK 21 TAB) 10 MG (21) TBPK tablet Take by mouth daily. As directed 06/25/22  Yes Sharion Balloon, NP  fluticasone (FLONASE) 50 MCG/ACT nasal spray Place 2 sprays into both nostrils daily. 01/30/22   Michela Pitcher, NP  promethazine (PHENERGAN) 12.5 MG tablet Take 1 tablet (12.5 mg total) by mouth every 6 (six) hours as needed for nausea or vomiting. Patient not taking: Reported on 01/30/2022 01/06/21   Duanne Guess, PA-C    Family History Family History  Problem Relation Age of Onset   Healthy Mother    Migraines Mother    Diabetes Father    Kidney Stones Father    Healthy Brother     Social History Social History   Tobacco Use   Smoking status: Never   Smokeless tobacco: Never   Tobacco comments:    None  Vaping Use   Vaping Use: Never used  Substance Use Topics   Alcohol use: Yes    Alcohol/week: 2.0 standard drinks of alcohol    Types: 2 Cans of beer per week    Comment: Just in a social setting with my friends I play hockey with   Drug use: No     Allergies   Patient has no known allergies.   Review of Systems Review of Systems  Constitutional:  Negative for chills and fever.  HENT:  Negative for sore throat and trouble swallowing.   Respiratory:  Negative for cough and shortness of breath.   Gastrointestinal:  Negative for nausea and vomiting.  Skin:  Positive for rash.     Physical Exam Triage Vital Signs ED Triage Vitals  Enc Vitals Group     BP      Pulse      Resp      Temp      Temp src      SpO2      Weight      Height      Head Circumference      Peak Flow      Pain Score      Pain Loc      Pain Edu?      Excl. in [place]?    No data found.  Updated Vital Signs BP 133/89 (BP Location: Right Arm)   Pulse 77   Temp 99.1 F (37.3 C) (Oral)   Resp 18   SpO2 98%   Visual Acuity Right Eye Distance:   Left Eye Distance:   Bilateral Distance:    Right Eye Near:   Left Eye Near:    Bilateral  Near:     Physical Exam Vitals and nursing note reviewed.  Constitutional:      General: He is not in acute distress.    Appearance: Normal appearance. He is well-developed. He is not ill-appearing.  HENT:     Mouth/Throat:     Mouth: Mucous membranes are moist.     Pharynx: Oropharynx is clear.  Cardiovascular:     Rate and Rhythm: Normal rate and regular rhythm.     Heart sounds: Normal heart sounds.  Pulmonary:     Effort: Pulmonary effort is normal. No respiratory distress.     Breath sounds: Normal breath sounds.  Musculoskeletal:     Cervical back: Neck supple.  Skin:    General: Skin is warm and dry.     Findings: Rash present.     Comments: Patchy and papular erythematous rash on face, trunk, and extremities.    Neurological:     Mental Status: He is alert.  Psychiatric:        Mood and Affect: Mood normal.        Behavior: Behavior normal.      UC Treatments / Results  Labs (all labs ordered are listed, but only abnormal results are displayed) Labs Reviewed - No data to display  EKG   Radiology No results found.  Procedures Procedures (including critical care time)  Medications Ordered in UC Medications  dexamethasone (DECADRON) injection 10 mg (has no administration in time range)    Initial Impression / Assessment and Plan / UC Course  I have reviewed the triage vital signs and the nursing notes.  Pertinent labs & imaging results that were available during my care of the patient were reviewed by me and considered in my medical decision making (see chart for details).    Allergic dermatitis.  Dexamethasone given here; starting prednisone taper tomorrow.  Discussed Benadryl or Zyrtec also.  Education provided on contact dermatitis.  Instructed patient to follow-up with his PCP.  ED precautions given.  Patient agrees to plan of care.  Final Clinical Impressions(s) / UC Diagnoses   Final diagnoses:  Allergic dermatitis     Discharge  Instructions      You were given an injection of a steroid called  dexamethasone.  Start the prednisone taper tomorrow as directed.    Take Benadryl or Zyrtec as directed.    Follow up with your primary care provider.   Go to the emergency department if you have difficulty swallowing or breathing.         ED Prescriptions     Medication Sig Dispense Auth. Provider   predniSONE (STERAPRED UNI-PAK 21 TAB) 10 MG (21) TBPK tablet Take by mouth daily. As directed 21 tablet Sharion Balloon, NP      PDMP not reviewed this encounter.   Sharion Balloon, NP 06/24/22 [phone]

## 2022-06-24 NOTE — Discharge Instructions (Addendum)
You were given an injection of a steroid called dexamethasone.  Start the prednisone taper tomorrow as directed.    Take Benadryl or Zyrtec as directed.    Follow up with your primary care provider.   Go to the emergency department if you have difficulty swallowing or breathing.    

## 2022-06-24 NOTE — ED Triage Notes (Signed)
Red rash on pt's face, left and right arm, and groin that started last night. Taking benadryl and it helped stop the itching but still having the rash. Has not spread anymore since this morning. Pt states this has happened 3 times in the past year and don't know the cause.

## 2022-07-01 ENCOUNTER — Ambulatory Visit (INDEPENDENT_AMBULATORY_CARE_PROVIDER_SITE_OTHER): Payer: BLUE CROSS/BLUE SHIELD | Admitting: Nurse Practitioner

## 2022-07-01 ENCOUNTER — Encounter: Payer: Self-pay | Admitting: Nurse Practitioner

## 2022-07-01 VITALS — BP 108/62 | HR 78 | Temp 98.8°F | Resp 16 | Ht 68.0 in | Wt 165.1 lb

## 2022-07-01 DIAGNOSIS — L239 Allergic contact dermatitis, unspecified cause: Secondary | ICD-10-CM | POA: Diagnosis not present

## 2022-07-01 DIAGNOSIS — L0100 Impetigo, unspecified: Secondary | ICD-10-CM

## 2022-07-01 MED ORDER — MUPIROCIN 2 % EX OINT: 1.0000 | TOPICAL_OINTMENT | Freq: Two times a day (BID) | CUTANEOUS | 0 refills | Status: DC

## 2022-07-01 NOTE — Progress Notes (Signed)
   Established Patient Office Visit  Subjective   Patient ID: Dillon Park, male    DOB: 30-May-1995  Age: 27 y.o. MRN: [id number]  Chief Complaint  Patient presents with   Rash    Itches and had for 1 week.      UC follow up/rash: patient was seen at [facility] on 06/24/2022 and was diagnosed with allergic dermatitis.  Patient was given dexamethasone 10 mg and sent home on a prednisone 10 mg taper pack.  Patient was instructed to take Benadryl at or Zyrtec as directed.  Patient is finished the prednisone taper yesterday and is here for follow-up.  States that he can take benadryl twice a day that has helped some. States that it does help him sleep. States that the rash has improved some but is still present. He thinks that it came from gardening as his fiance also has a similar rash but not as bad. They do not live together nor sleep in the same bed every night     Review of Systems  Constitutional:  Negative for chills and fever.  Respiratory:  Negative for shortness of breath.   Skin:  Positive for itching and rash.      Objective:     BP 108/62   Pulse 78   Temp 98.8 F (37.1 C)   Resp 16   Ht 5\' 8"  (1.727 m)   Wt 165 lb 2 oz (74.9 kg)   SpO2 99%   BMI 25.11 kg/m    Physical Exam Constitutional:      Appearance: Normal appearance.  Cardiovascular:     Rate and Rhythm: Normal rate and regular rhythm.     Heart sounds: Normal heart sounds.  Pulmonary:     Effort: Pulmonary effort is normal.     Breath sounds: Normal breath sounds.  Skin:    Findings: Erythema, lesion and rash present.  Neurological:     Mental Status: He is alert.      No results found for any visits on 07/01/22.    The ASCVD Risk score (Arnett DK, et al., 2019) failed to calculate for the following reasons:   The 2019 ASCVD risk score is only valid for ages 48 to 45    Assessment & Plan:   Problem List Items Addressed This Visit       Musculoskeletal and Integument   Impetigo     Some areas were getting signs of secondary infection of impetigo.  Will write Bactroban ointment twice daily for 7 days.      Relevant Medications   mupirocin ointment (BACTROBAN) 2 %   Allergic dermatitis - Primary    Patient was seen in urgent care and given IM steroids and a prednisone Dosepak that he has now finished.  Having some improvement but still experiencing itching.  Patient is taking Benadryl 25 mg twice a day with good relief.  He can continue such to give dosing recommendations and limits in regards to Benadryl.  Will refer patient to allergist for further testing as he seems to be getting a dermatitis quite frequently this is his third time within a year      Relevant Orders   Ambulatory referral to Allergy    Return in about 7 months (around 02/01/2023) for CPE and Labs.    Audria Nine, NP

## 2022-07-01 NOTE — Patient Instructions (Signed)
Nice to see you today Do luke warm bathing, pat dry after bathing. You can use benadryl up to 4 times a day. No more than 100mg  in 24 hours. You can get over the counter oatmeal bath that can help soothe. I think [employer] makes a version I have called in a cream to use twice a day on the open spots for the next week.  I have also referred you to an allergy specialist

## 2022-07-01 NOTE — Assessment & Plan Note (Signed)
Patient was seen in urgent care and given IM steroids and a prednisone Dosepak that he has now finished.  Having some improvement but still experiencing itching.  Patient is taking Benadryl 25 mg twice a day with good relief.  He can continue such to give dosing recommendations and limits in regards to Benadryl.  Will refer patient to allergist for further testing as he seems to be getting a dermatitis quite frequently this is his third time within a year

## 2022-07-01 NOTE — Assessment & Plan Note (Signed)
Some areas were getting signs of secondary infection of impetigo.  Will write Bactroban ointment twice daily for 7 days.

## 2022-07-02 ENCOUNTER — Encounter: Payer: Self-pay | Admitting: *Deleted

## 2023-02-04 ENCOUNTER — Encounter: Payer: Self-pay | Admitting: Nurse Practitioner

## 2023-02-04 ENCOUNTER — Ambulatory Visit (INDEPENDENT_AMBULATORY_CARE_PROVIDER_SITE_OTHER): Payer: 59 | Admitting: Nurse Practitioner

## 2023-02-04 VITALS — BP 118/84 | HR 67 | Temp 98.3°F | Ht 68.0 in | Wt 160.0 lb

## 2023-02-04 DIAGNOSIS — J301 Allergic rhinitis due to pollen: Secondary | ICD-10-CM | POA: Diagnosis not present

## 2023-02-04 DIAGNOSIS — J302 Other seasonal allergic rhinitis: Secondary | ICD-10-CM

## 2023-02-04 DIAGNOSIS — Z23 Encounter for immunization: Secondary | ICD-10-CM

## 2023-02-04 DIAGNOSIS — Z0000 Encounter for general adult medical examination without abnormal findings: Secondary | ICD-10-CM

## 2023-02-04 DIAGNOSIS — Z0001 Encounter for general adult medical examination with abnormal findings: Secondary | ICD-10-CM | POA: Diagnosis not present

## 2023-02-04 DIAGNOSIS — E785 Hyperlipidemia, unspecified: Secondary | ICD-10-CM

## 2023-02-04 HISTORY — DX: Hyperlipidemia, unspecified: E78.5

## 2023-02-04 LAB — CBC
HCT: 46.5 % (ref 39.0–52.0)
Hemoglobin: 15.8 g/dL (ref 13.0–17.0)
MCHC: 33.9 g/dL (ref 30.0–36.0)
MCV: 90.2 fL (ref 78.0–100.0)
Platelets: 277 10*3/uL (ref 150.0–400.0)
RBC: 5.15 Mil/uL (ref 4.22–5.81)
RDW: 12.7 % (ref 11.5–15.5)
WBC: 5.3 10*3/uL (ref 4.0–10.5)

## 2023-02-04 LAB — LIPID PANEL
Cholesterol: 238 mg/dL — ABNORMAL HIGH (ref 0–200)
HDL: 43.4 mg/dL (ref >39.00)
LDL Cholesterol: 162 mg/dL — ABNORMAL HIGH (ref 0–99)
NonHDL: 194.9
Total CHOL/HDL Ratio: 5
Triglycerides: 167 mg/dL — ABNORMAL HIGH (ref 0.0–149.0)
VLDL: 33.4 mg/dL (ref 0.0–40.0)

## 2023-02-04 LAB — COMPREHENSIVE METABOLIC PANEL
ALT: 50 U/L (ref 0–53)
AST: 27 U/L (ref 0–37)
Albumin: 4.8 g/dL (ref 3.5–5.2)
Alkaline Phosphatase: 71 U/L (ref 39–117)
BUN: 15 mg/dL (ref 6–23)
CO2: 30 meq/L (ref 19–32)
Calcium: 10.3 mg/dL (ref 8.4–10.5)
Chloride: 102 meq/L (ref 96–112)
Creatinine, Ser: 1.09 mg/dL (ref 0.40–1.50)
GFR: 93.4 mL/min (ref >60.00)
Glucose, Bld: 91 mg/dL (ref 70–99)
Potassium: 4.2 meq/L (ref 3.5–5.1)
Sodium: 141 meq/L (ref 135–145)
Total Bilirubin: 0.6 mg/dL (ref 0.2–1.2)
Total Protein: 7.3 g/dL (ref 6.0–8.3)

## 2023-02-04 LAB — TSH: TSH: 1.48 u[IU]/mL (ref 0.35–5.50)

## 2023-02-04 MED ORDER — LEVOCETIRIZINE DIHYDROCHLORIDE 5 MG PO TABS: 5.0000 mg | ORAL_TABLET | Freq: Every evening | ORAL | 3 refills | Status: DC

## 2023-02-04 NOTE — Assessment & Plan Note (Signed)
Patient currently maintained on levocetirizine 5 mg daily and Flonase daily.  Well-controlled with medications continue medication as prescribed if patient decides he would like to see an allergist in the future he can let me know

## 2023-02-04 NOTE — Patient Instructions (Signed)
Nice to see you today I will be in touch with the labs once I have reviewed them Follow up with me in 1 year for your next physical, sooner if you need me

## 2023-02-04 NOTE — Progress Notes (Signed)
Established Patient Office Visit  Subjective   Patient ID: Dillon Park, male    DOB: 06/01/1995  Age: 27 y.o. MRN: [id number]  Chief Complaint  Patient presents with   Annual Exam    HPI  for complete physical and follow up of chronic conditions.   Allergies; xyzal and flonase. States that it does help.  States that him and his wife moved into an apartment and the cat is no longer running in the woods and brings exam this is also help with his allergies.  States he is unable to see the allergist in the past due to they were out of network  Immunizations: -Tetanus: Completed in 2023 -Influenza: update today  -Shingles: Too young -Pneumonia: Too young -covid: original series  Diet: Fair diet. He will eat 3 meals a day. He will not snack much. He will dirnk chai tea and water mostly  Exercise: Hockey every week. Walks every other day   Eye exam: PRN Dental exam: Needs updating   Colonoscopy: Too young, currently average risk Lung Cancer Screening: N/A  PSA: Too young, currently average risk   Sleep: states that he recently got married and is dealing with a cat and spouse. States that he is using the melatonin.  States that he will get 9 hours of sleep and sometimes feels rested some times   STI/STD: No concerns declined testing      Review of Systems  Constitutional:  Negative for chills and fever.  Respiratory:  Negative for shortness of breath.   Cardiovascular:  Negative for chest pain and leg swelling.  Gastrointestinal:  Negative for abdominal pain, blood in stool, constipation, diarrhea, nausea and vomiting.       BM every other day  Genitourinary:  Negative for dysuria and hematuria.  Neurological:  Negative for tingling and headaches.  Psychiatric/Behavioral:  Negative for hallucinations and suicidal ideas.       Objective:     BP 118/84   Pulse 67   Temp 98.3 F (36.8 C)   Ht 5\' 8"  (1.727 m)   Wt 160 lb (72.6 kg)   SpO2 98%   BMI 24.33  kg/m  BP Readings from Last 3 Encounters:  02/04/23 118/84  07/01/22 108/62  06/24/22 133/89   Wt Readings from Last 3 Encounters:  02/04/23 160 lb (72.6 kg)  07/01/22 165 lb 2 oz (74.9 kg)  01/30/22 163 lb (73.9 kg)   SpO2 Readings from Last 3 Encounters:  02/04/23 98%  07/01/22 99%  06/24/22 98%      Physical Exam Vitals and nursing note reviewed. Exam conducted with a chaperone present Prince Solian, CMA).  Constitutional:      Appearance: Normal appearance.  HENT:     Head:      Right Ear: Tympanic membrane, ear canal and external ear normal.     Left Ear: Tympanic membrane, ear canal and external ear normal.     Mouth/Throat:     Mouth: Mucous membranes are moist.     Pharynx: Oropharynx is clear.  Eyes:     Extraocular Movements: Extraocular movements intact.     Pupils: Pupils are equal, round, and reactive to light.  Cardiovascular:     Rate and Rhythm: Normal rate and regular rhythm.     Pulses: Normal pulses.     Heart sounds: Normal heart sounds.  Pulmonary:     Effort: Pulmonary effort is normal.     Breath sounds: Normal breath sounds.  Abdominal:  General: Bowel sounds are normal. There is no distension.     Palpations: There is no mass.     Tenderness: There is no abdominal tenderness.     Hernia: No hernia is present. There is no hernia in the left inguinal area or right inguinal area.  Genitourinary:    Penis: Normal.      Testes: Normal.     Epididymis:     Right: Normal.     Left: Normal.  Musculoskeletal:     Right lower leg: No edema.     Left lower leg: No edema.  Lymphadenopathy:     Cervical: Cervical adenopathy present.     Lower Body: No right inguinal adenopathy. No left inguinal adenopathy.  Skin:    General: Skin is warm.  Neurological:     General: No focal deficit present.     Mental Status: He is alert.     Deep Tendon Reflexes:     Reflex Scores:      Bicep reflexes are 2+ on the right side and 2+ on the left  side.      Patellar reflexes are 2+ on the right side and 2+ on the left side.    Comments: Bilateral upper and lower extremity strength 5/5  Psychiatric:        Mood and Affect: Mood normal.        Behavior: Behavior normal.        Thought Content: Thought content normal.        Judgment: Judgment normal.      No results found for any visits on 02/04/23.    The ASCVD Risk score (Arnett DK, et al., 2019) failed to calculate for the following reasons:   The 2019 ASCVD risk score is only valid for ages 57 to 26    Assessment & Plan:   Problem List Items Addressed This Visit       Respiratory   Seasonal allergic rhinitis due to pollen    Patient currently maintained on levocetirizine 5 mg daily and Flonase daily.  Well-controlled with medications continue medication as prescribed if patient decides he would like to see an allergist in the future he can let me know      Relevant Medications   levocetirizine (XYZAL) 5 MG tablet     Other   Preventative health care - Primary    Discussed age-appropriate immunizations and screening exams.  Did review patient's personal, surgical, social, family histories.  Patient is up-to-date on all age-appropriate vaccinations he would like.  Update flu vaccine today.  Patient was given information at discharge about preventative healthcare maintenance with anticipatory guidance.  Patient is too young for CRC screening or prostate cancer screening.      Relevant Orders   CBC   Comprehensive metabolic panel   TSH   Seasonal allergies    2/2 levocetirizine and Flonase as prescribed.      Relevant Medications   levocetirizine (XYZAL) 5 MG tablet   Hyperlipidemia    Elevated LDL in the past.  Patient leads a healthy lifestyle pending labs today      Relevant Orders   Lipid panel   Other Visit Diagnoses     Encounter for administration of vaccine       Relevant Orders   Flu vaccine trivalent PF, 6mos and  older(Flulaval,Afluria,Fluarix,Fluzone) (Completed)       Return in about 1 year (around 02/04/2024) for CPE and Labs.    Audria Nine, NP

## 2023-02-04 NOTE — Assessment & Plan Note (Signed)
2/2 levocetirizine and Flonase as prescribed.

## 2023-02-04 NOTE — Assessment & Plan Note (Signed)
Elevated LDL in the past.  Patient leads a healthy lifestyle pending labs today

## 2023-02-04 NOTE — Assessment & Plan Note (Signed)
Discussed age-appropriate immunizations and screening exams.  Did review patient's personal, surgical, social, family histories.  Patient is up-to-date on all age-appropriate vaccinations he would like.  Update flu vaccine today.  Patient was given information at discharge about preventative healthcare maintenance with anticipatory guidance.  Patient is too young for CRC screening or prostate cancer screening.

## 2023-12-07 ENCOUNTER — Ambulatory Visit (INDEPENDENT_AMBULATORY_CARE_PROVIDER_SITE_OTHER): Payer: Self-pay | Admitting: Nurse Practitioner

## 2023-12-07 ENCOUNTER — Ambulatory Visit (INDEPENDENT_AMBULATORY_CARE_PROVIDER_SITE_OTHER)
Admission: RE | Admit: 2023-12-07 | Discharge: 2023-12-07 | Disposition: A | Discharge status: Home / Self Care | Source: Ambulatory Visit | Attending: Nurse Practitioner | Admitting: Nurse Practitioner

## 2023-12-07 VITALS — BP 102/78 | HR 60 | Temp 98.1°F | Ht 68.0 in | Wt 161.2 lb

## 2023-12-07 DIAGNOSIS — R0789 Other chest pain: Secondary | ICD-10-CM

## 2023-12-07 MED ORDER — NAPROXEN 500 MG PO TABS: 500.0000 mg | ORAL_TABLET | Freq: Two times a day (BID) | ORAL | 0 refills | Status: DC

## 2023-12-07 NOTE — Patient Instructions (Signed)
 Nice to see you today Take the naproxen  twice a day with food for the next 5-7 days  I will be in touch with the xray results once I have them  Follow up with me in 2 months for you physical and labs

## 2023-12-07 NOTE — Progress Notes (Signed)
 Acute Office Visit  Subjective:     Patient ID: Dillon Park, male    DOB: [birth year], 28 y.o.   MRN: [id number]  Chief Complaint  Patient presents with   Chest Pain    Pt complains of sternum and upper back issues/dull pain that's ongoing for 4 months. States he has to stretch to gain any relief. Any time he compresses arms he has pain.  No SOB or pain when taking deep breaths. Complains of having to pop chest more in the last 2 months.       Discussed the use of AI scribe software for clinical note transcription with the patient, who gave verbal consent to proceed.  History of Present Illness Dillon Park is a 28 year old male who presents with chest pain and tightness.  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 The pain is located around the sternum and is described as an aching sensation that comes and goes. Initially, he could relieve the tightness by stretching or having his wife squeeze him, but in the past week, he has been unable to relieve it, and it has become painful. He has also mentioned the need to pop his chest.  The pain is described as surface-level and not deep, with no associated injury or trauma to the chest. It is exacerbated by compressing the chest or flexing and extending the chest muscles, but not by lifting or sitting still. No shortness of breath, numbness, tingling, or radiation of pain to the arms or neck. He has tried any medications like acetaminophen   for the pain that has not helped   He exercises every other day for 30 minutes to an hour, focusing on cardio and upper body weightlifting, which does not exacerbate the pain. He mentions a recent increase in pain last Friday, where touching the chest was painful, but denies any recent illness or changes in pain with position changes. He also notes a cyst on the skin above the painful area, which is  inflamed.       12/07/2023   12:08 PM 02/04/2023    8:56 AM 07/01/2022   12:08 PM  PHQ9 SCORE ONLY  PHQ-9 Total Score 10 8  0      Data saved with a previous flowsheet row definition    Review of Systems  Constitutional:  Negative for chills and fever.  Respiratory:  Negative for shortness of breath.   Cardiovascular:  Positive for chest pain.  Neurological:  Negative for headaches.        Objective:    BP 102/78   Pulse 60   Temp 98.1 F (36.7 C) (Oral)   Ht 5' 8 (1.727 m)   Wt 161 lb 3.2 oz (73.1 kg)   SpO2 98%   BMI 24.51 kg/m    Physical Exam Vitals and nursing note reviewed.  Constitutional:      Appearance: Normal appearance.  Cardiovascular:     Rate and Rhythm: Normal rate and regular rhythm.     Heart sounds: Normal heart sounds.  Pulmonary:     Effort: Pulmonary effort is normal.     Breath sounds: Normal breath sounds.  Chest:     Chest wall: Tenderness present.  Musculoskeletal:     Comments: Worse with lumbar extension and lateral rotation   Neurological:     Mental Status: He is alert.     No results  found for any visits on 12/07/23.      Assessment & Plan:   Problem List Items Addressed This Visit   None Visit Diagnoses       Chest wall pain    -  Primary   Relevant Medications   naproxen  (NAPROSYN ) 500 MG tablet   Other Relevant Orders   DG Chest 2 View      Assessment and Plan Assessment & Plan Costochondritis Intermittent sternum pain for three months, worsened recently. Likely costochondritis due to cartilage inflammation. Considered idiopathic or viral causes. Differential includes muscular strain. - Order chest x-ray to rule out structural abnormalities. - Prescribe naproxen  500 mg twice daily for 5-7 days with food. - Refer to physical therapy if x-ray normal and pain persists. - Advise to monitor for worsening symptoms or fever.  Meds ordered this encounter  Medications   naproxen  (NAPROSYN ) 500 MG tablet     Sig: Take 1 tablet (500 mg total) by mouth 2 (two) times daily with a meal.    Dispense:  30 tablet    Refill:  0    Supervising Provider:   RANDEEN HARDY A [[id number]]    Return in about 2 months (around 02/06/2024) for CPE and Labs.  Adina Crandall, NP

## 2023-12-10 ENCOUNTER — Ambulatory Visit: Payer: Self-pay | Admitting: Nurse Practitioner

## 2024-02-07 ENCOUNTER — Encounter: Admitting: Nurse Practitioner

## 2024-03-26 NOTE — Progress Notes (Unsigned)
 "  New Patient Note  RE: Dillon Park MRN: [id number] DOB: 03/18/1996 Date of Office Visit: 03/27/2024  Consult requested by: Wendee Lynwood HERO, NP Primary care provider: Wendee Lynwood HERO, NP  Chief Complaint: No chief complaint on file.  History of Present Illness: I had the pleasure of seeing Dillon Park for initial evaluation at the [facility] on 03/27/2024. He is a 29 y.o. male, who is referred here by Wendee Lynwood HERO, NP for the evaluation of seasonal allergies.  Discussed the use of AI scribe software for clinical note transcription with the patient, who gave verbal consent to proceed.  History of Present Illness             He reports symptoms of ***. Symptoms have been going on for *** years. The symptoms are present *** all year around with worsening in ***. Other triggers include exposure to ***. Anosmia: ***. Headache: ***. He has used *** with ***fair improvement in symptoms. Sinus infections: ***. Previous work up includes: ***. Previous ENT evaluation: ***. Previous sinus imaging: ***. History of nasal polyps: ***. Last eye exam: ***. History of reflux: ***.  Assessment and Plan: Dillon Park is a 29 y.o. male with: ***  Assessment and Plan               No follow-ups on file.  No orders of the defined types were placed in this encounter.  Lab Orders  No laboratory test(s) ordered today    Other allergy screening: Asthma: {Blank single:19197::yes,no} Rhino conjunctivitis: {Blank single:19197::yes,no} Food allergy: {Blank single:19197::yes,no} Medication allergy: {Blank single:19197::yes,no} Hymenoptera allergy: {Blank single:19197::yes,no} Urticaria: {Blank single:19197::yes,no} Eczema:{Blank single:19197::yes,no} History of recurrent infections suggestive of immunodeficency: {Blank single:19197::yes,no}  Diagnostics: Spirometry:  Tracings reviewed. His effort: {Blank single:19197::Good reproducible  efforts.,It was hard to get consistent efforts and there is a question as to whether this reflects a maximal maneuver.,Poor effort, data can not be interpreted.} FVC: ***L FEV1: ***L, ***% predicted FEV1/FVC ratio: ***% Interpretation: {Blank single:19197::Spirometry consistent with mild obstructive disease,Spirometry consistent with moderate obstructive disease,Spirometry consistent with severe obstructive disease,Spirometry consistent with possible restrictive disease,Spirometry consistent with mixed obstructive and restrictive disease,Spirometry uninterpretable due to technique,Spirometry consistent with normal pattern,No overt abnormalities noted given today's efforts}.  Please see scanned spirometry results for details.  Skin Testing: {Blank single:19197::Select foods,Environmental allergy panel,Environmental allergy panel and select foods,Food allergy panel,None,Deferred due to recent antihistamines use}. *** Results discussed with patient/family.   Past Medical History: Patient Active Problem List   Diagnosis Date Noted   Hyperlipidemia 02/04/2023   Impetigo 07/01/2022   Allergic dermatitis 07/01/2022   Preventative health care 01/30/2022   Fatigue 01/30/2022   Seasonal allergies 01/30/2022   Seasonal allergic rhinitis due to pollen 01/30/2022   COVID-19 virus infection 10/13/2019   Contusion of left knee 10/07/2018   Sore throat 01/30/2013   Soft tissue injury of neck 08/12/2012   Herpes zoster 11/19/2009   Concussion 03/07/2007   ARM PAIN, LEFT 08/12/2006   Past Medical History:  Diagnosis Date   Allergy 2020   I have had seasonal allergies since I was early teens   Anxiety 2023   This is something I have had the last few years   Concussion 2009   x2 (football/baseball)   COVID-19 virus infection 09/2019   GERD (gastroesophageal reflux disease) 2019   Very rare, but happens on occasion   Hyperlipidemia 02/04/2023    Zoster 11/2009   Past Surgical History: Past Surgical History:  Procedure Laterality Date  BUNIONECTOMY Right 2022   WISDOM TOOTH EXTRACTION     Medication List:  Current Outpatient Medications  Medication Sig Dispense Refill   fluticasone  (FLONASE ) 50 MCG/ACT nasal spray Place 2 sprays into both nostrils daily. 16 g 0   levocetirizine (XYZAL ) 5 MG tablet Take 1 tablet (5 mg total) by mouth every evening. 90 tablet 3   Multiple Vitamin (MULTIVITAMIN) capsule Take 1 capsule by mouth daily.     naproxen  (NAPROSYN ) 500 MG tablet Take 1 tablet (500 mg total) by mouth 2 (two) times daily with a meal. 30 tablet 0   No current facility-administered medications for this visit.   Allergies: Allergies[1] Social History: Social History   Socioeconomic History   Marital status: Married    Spouse name: Abigail   Number of children: 0   Years of education: Some college   Highest education level: Not on file  Occupational History   Not on file  Tobacco Use   Smoking status: Never   Smokeless tobacco: Never   Tobacco comments:    None  Vaping Use   Vaping status: Never Used  Substance and Sexual Activity   Alcohol use: Yes    Alcohol/week: 2.0 standard drinks of alcohol    Types: 2 Cans of beer per week    Comment: Just in a social setting with my friends I play hockey with   Drug use: No   Sexual activity: Never  Other Topics Concern   Not on file  Social History Narrative   sports broadcasting. Delivery driver jobs      Hobbies: hockey playing and watching hike and disc golf    Social Drivers of Health   Tobacco Use: Low Risk (02/04/2023)   Patient History    Smoking Tobacco Use: Never    Smokeless Tobacco Use: Never    Passive Exposure: Not on file  Financial Resource Strain: Not on file  Food Insecurity: Not on file  Transportation Needs: Not on file  Physical Activity: Not on file  Stress: Not on file  Social Connections: Not on file   Depression (PHQ2-9): Medium Risk (12/07/2023)   Depression (PHQ2-9)    PHQ-2 Score: 10  Alcohol Screen: Not on file  Housing: Not on file  Utilities: Not on file  Health Literacy: Not on file   Lives in a ***. Smoking: *** Occupation: ***  Environmental HistorySurveyor, Minerals in the house: Network Engineer in the family room: {Blank single:19197::yes,no} Carpet in the bedroom: {Blank single:19197::yes,no} Heating: {Blank single:19197::electric,gas,heat pump} Cooling: {Blank single:19197::central,window,heat pump} Pet: {Blank single:19197::yes ***,no}  Family History: Family History  Problem Relation Age of Onset   Healthy Mother    Migraines Mother    Diabetes Father    Kidney Stones Father    Healthy Brother    Problem                               Relation Asthma                                   *** Eczema                                *** Food allergy                          ***  Allergic rhino conjunctivitis     ***  Review of Systems  Constitutional:  Negative for appetite change, chills, fever and unexpected weight change.  HENT:  Negative for congestion and rhinorrhea.   Eyes:  Negative for itching.  Respiratory:  Negative for cough, chest tightness, shortness of breath and wheezing.   Cardiovascular:  Negative for chest pain.  Gastrointestinal:  Negative for abdominal pain.  Genitourinary:  Negative for difficulty urinating.  Skin:  Negative for rash.  Neurological:  Negative for headaches.    Objective: There were no vitals taken for this visit. There is no height or weight on file to calculate BMI. Physical Exam Vitals and nursing note reviewed.  Constitutional:      Appearance: Normal appearance. He is well-developed.  HENT:     Head: Normocephalic and atraumatic.     Right Ear: Tympanic membrane and external ear normal.     Left Ear: Tympanic membrane and external ear normal.     Nose:  Nose normal.     Mouth/Throat:     Mouth: Mucous membranes are moist.     Pharynx: Oropharynx is clear.  Eyes:     Conjunctiva/sclera: Conjunctivae normal.  Cardiovascular:     Rate and Rhythm: Normal rate and regular rhythm.     Heart sounds: Normal heart sounds. No murmur heard.    No friction rub. No gallop.  Pulmonary:     Effort: Pulmonary effort is normal.     Breath sounds: Normal breath sounds. No wheezing, rhonchi or rales.  Musculoskeletal:     Cervical back: Neck supple.  Skin:    General: Skin is warm.     Findings: No rash.  Neurological:     Mental Status: He is alert and oriented to person, place, and time.  Psychiatric:        Behavior: Behavior normal.   The plan was reviewed with the patient/family, and all questions/concerned were addressed.  It was my pleasure to see Dillon Park today and participate in his care. Please feel free to contact me with any questions or concerns.  Sincerely,  Orlan Cramp, DO Allergy & Immunology  [facility]  [facility] office: [phone] [facility] [place] office: [phone]     [1] No Known Allergies "

## 2024-03-27 ENCOUNTER — Encounter: Payer: Self-pay | Admitting: Nurse Practitioner

## 2024-03-27 ENCOUNTER — Other Ambulatory Visit: Payer: Self-pay

## 2024-03-27 ENCOUNTER — Ambulatory Visit (INDEPENDENT_AMBULATORY_CARE_PROVIDER_SITE_OTHER): Admitting: Allergy

## 2024-03-27 ENCOUNTER — Ambulatory Visit (INDEPENDENT_AMBULATORY_CARE_PROVIDER_SITE_OTHER): Admitting: Nurse Practitioner

## 2024-03-27 ENCOUNTER — Ambulatory Visit
Admission: RE | Admit: 2024-03-27 | Discharge: 2024-03-27 | Disposition: A | Discharge status: Home / Self Care | Source: Ambulatory Visit | Attending: Nurse Practitioner | Admitting: Nurse Practitioner

## 2024-03-27 VITALS — BP 110/78 | HR 70 | Temp 98.3°F | Ht 66.5 in | Wt 163.4 lb

## 2024-03-27 VITALS — BP 114/72 | HR 77 | Temp 98.0°F | Resp 18 | Ht 67.0 in | Wt 166.4 lb

## 2024-03-27 DIAGNOSIS — E7800 Pure hypercholesterolemia, unspecified: Secondary | ICD-10-CM | POA: Diagnosis not present

## 2024-03-27 DIAGNOSIS — Z23 Encounter for immunization: Secondary | ICD-10-CM

## 2024-03-27 DIAGNOSIS — Z0000 Encounter for general adult medical examination without abnormal findings: Secondary | ICD-10-CM | POA: Diagnosis not present

## 2024-03-27 DIAGNOSIS — Z1159 Encounter for screening for other viral diseases: Secondary | ICD-10-CM

## 2024-03-27 DIAGNOSIS — J301 Allergic rhinitis due to pollen: Secondary | ICD-10-CM

## 2024-03-27 DIAGNOSIS — J302 Other seasonal allergic rhinitis: Secondary | ICD-10-CM

## 2024-03-27 DIAGNOSIS — L2089 Other atopic dermatitis: Secondary | ICD-10-CM

## 2024-03-27 DIAGNOSIS — J3089 Other allergic rhinitis: Secondary | ICD-10-CM

## 2024-03-27 DIAGNOSIS — F411 Generalized anxiety disorder: Secondary | ICD-10-CM | POA: Insufficient documentation

## 2024-03-27 DIAGNOSIS — H1013 Acute atopic conjunctivitis, bilateral: Secondary | ICD-10-CM | POA: Diagnosis not present

## 2024-03-27 DIAGNOSIS — Z131 Encounter for screening for diabetes mellitus: Secondary | ICD-10-CM

## 2024-03-27 DIAGNOSIS — Z114 Encounter for screening for human immunodeficiency virus [HIV]: Secondary | ICD-10-CM

## 2024-03-27 DIAGNOSIS — R61 Generalized hyperhidrosis: Secondary | ICD-10-CM | POA: Insufficient documentation

## 2024-03-27 LAB — CBC WITH DIFFERENTIAL/PLATELET
Basophils Absolute: 0 K/uL (ref 0.0–0.1)
Basophils Relative: 0.8 % (ref 0.0–3.0)
Eosinophils Absolute: 0.1 K/uL (ref 0.0–0.7)
Eosinophils Relative: 2.2 % (ref 0.0–5.0)
HCT: 44.2 % (ref 39.0–52.0)
Hemoglobin: 15.2 g/dL (ref 13.0–17.0)
Lymphocytes Relative: 28.5 % (ref 12.0–46.0)
Lymphs Abs: 1.5 K/uL (ref 0.7–4.0)
MCHC: 34.4 g/dL (ref 30.0–36.0)
MCV: 88.8 fl (ref 78.0–100.0)
Monocytes Absolute: 0.5 K/uL (ref 0.1–1.0)
Monocytes Relative: 9 % (ref 3.0–12.0)
Neutro Abs: 3.1 K/uL (ref 1.4–7.7)
Neutrophils Relative %: 59.5 % (ref 43.0–77.0)
Platelets: 254 K/uL (ref 150.0–400.0)
RBC: 4.98 Mil/uL (ref 4.22–5.81)
RDW: 12.6 % (ref 11.5–15.5)
WBC: 5.1 K/uL (ref 4.0–10.5)

## 2024-03-27 LAB — COMPREHENSIVE METABOLIC PANEL WITH GFR
ALT: 61 U/L — ABNORMAL HIGH (ref 3–53)
AST: 24 U/L (ref 5–37)
Albumin: 4.7 g/dL (ref 3.5–5.2)
Alkaline Phosphatase: 54 U/L (ref 39–117)
BUN: 16 mg/dL (ref 6–23)
CO2: 32 meq/L (ref 19–32)
Calcium: 10 mg/dL (ref 8.4–10.5)
Chloride: 103 meq/L (ref 96–112)
Creatinine, Ser: 1.01 mg/dL (ref 0.40–1.50)
GFR: 101.52 mL/min
Glucose, Bld: 78 mg/dL (ref 70–99)
Potassium: 4.4 meq/L (ref 3.5–5.1)
Sodium: 139 meq/L (ref 135–145)
Total Bilirubin: 0.5 mg/dL (ref 0.2–1.2)
Total Protein: 7.2 g/dL (ref 6.0–8.3)

## 2024-03-27 LAB — LIPID PANEL
Cholesterol: 232 mg/dL — ABNORMAL HIGH (ref 28–200)
HDL: 43.9 mg/dL
LDL Cholesterol: 158 mg/dL — ABNORMAL HIGH (ref 10–99)
NonHDL: 188.54
Total CHOL/HDL Ratio: 5
Triglycerides: 154 mg/dL — ABNORMAL HIGH (ref 10.0–149.0)
VLDL: 30.8 mg/dL (ref 0.0–40.0)

## 2024-03-27 LAB — TSH: TSH: 1.29 u[IU]/mL (ref 0.35–5.50)

## 2024-03-27 LAB — HEMOGLOBIN A1C: Hgb A1c MFr Bld: 4.9 % (ref 4.6–6.5)

## 2024-03-27 MED ORDER — AZELASTINE-FLUTICASONE 137-50 MCG/ACT NA SUSP: 1.0000 | Freq: Two times a day (BID) | NASAL | 5 refills | Status: AC

## 2024-03-27 MED ORDER — LEVOCETIRIZINE DIHYDROCHLORIDE 5 MG PO TABS: 5.0000 mg | ORAL_TABLET | Freq: Every evening | ORAL | 3 refills | Status: AC

## 2024-03-27 MED ORDER — SERTRALINE HCL 50 MG PO TABS: ORAL_TABLET | ORAL | 0 refills | Status: DC

## 2024-03-27 NOTE — Assessment & Plan Note (Signed)
 The same and allergies to cat that lives at home.  Will continue Xyzal  5 mg daily.  Refill provided today.  Patient has not appointment for consultation with allergist this week

## 2024-03-27 NOTE — Assessment & Plan Note (Signed)
 Discussed age-appropriate musicians and screening exams.  Did review patient's personal, surgical, social, family histories.  Patient up-to-date with all age-appropriate vaccinations he would like.  Update flu vaccine today.  Patient is too young for CRC screening or prostate cancer screening.  Patient was given information at discharge about preventative healthcare maintenance with anticipatory guidance.

## 2024-03-27 NOTE — Patient Instructions (Addendum)
 Rhinitis  Use over the counter antihistamines such as Zyrtec (cetirizine), Claritin (loratadine), Allegra (fexofenadine), or Xyzal  (levocetirizine) daily as needed. May take twice a day during allergy flares. May switch antihistamines every few months. Start dymista  (fluticasone  + azelastine  nasal spray combination) 1 spray per nostril twice a day. This replaces your other nasal sprays. If it's not covered let us  know.  Nasal saline spray (i.e., Simply Saline) or nasal saline lavage (i.e., NeilMed) is recommended as needed and prior to medicated nasal sprays. Get bloodwork If no significant positives then we may still have to do skin testing.  We are ordering labs, so please allow 1-2 weeks for the results to come back. With the newly implemented Cures Act, the labs might be visible to you at the same time that they become visible to me. However, I will not address the results until all of the results are back, so please be patient.  In the meantime, continue recommendations in your patient instructions, including avoidance measures (if applicable), until you hear from me. Read about allergy injections.   Skin Keep track of rashes and take pictures. See below for proper skin care. Use fragrance free and dye free products. No dryer sheets or fabric softener.    Follow up in 6 months or sooner if needed.   Skin care recommendations  Bath time: Always use lukewarm water. AVOID very hot or cold water. Keep bathing time to 5-10 minutes. Do NOT use bubble bath. Use a mild soap and use just enough to wash the dirty areas. Do NOT scrub skin vigorously.  After bathing, pat dry your skin with a towel. Do NOT rub or scrub the skin.  Moisturizers and prescriptions:  ALWAYS apply moisturizers immediately after bathing (within 3 minutes). This helps to lock-in moisture. Use the moisturizer several times a day over the whole body. Good summer moisturizers include: Aveeno, CeraVe, Cetaphil. Good  winter moisturizers include: Aquaphor, Vaseline, Cerave, Cetaphil, Eucerin, Vanicream. When using moisturizers along with medications, the moisturizer should be applied about one hour after applying the medication to prevent diluting effect of the medication or moisturize around where you applied the medications. When not using medications, the moisturizer can be continued twice daily as maintenance.  Laundry and clothing: Avoid laundry products with added color or perfumes. Use unscented hypo-allergenic laundry products such as Tide free, Cheer free & gentle, and All free and clear.  If the skin still seems dry or sensitive, you can try double-rinsing the clothes. Avoid tight or scratchy clothing such as wool. Do not use fabric softeners or dyer sheets.

## 2024-03-27 NOTE — Assessment & Plan Note (Signed)
 Ambiguous nature.  Will check basic labs inclusive of CBC CMP TSH UA and chest x-ray.  Query if this is a manifestation of anxiety.

## 2024-03-27 NOTE — Patient Instructions (Signed)
 Nice to see you today I will be in touch with the labs once I have them  I want to see you in 6 weeks, sooner If you need me  We did up date your flu vaccine today

## 2024-03-27 NOTE — Assessment & Plan Note (Signed)
 Start patient on sertraline  25 mg daily for 10 days then titrate up to 50 mg daily thereafter.  Patient denies HI/SI/AVH.  Discussed routine side effects with medication use.  Patient has an appoint with therapy coming up also

## 2024-03-27 NOTE — Progress Notes (Signed)
 "  Established Patient Office Visit  Subjective   Patient ID: Dillon Park, male    DOB: [birth year]  Age: 29 y.o. MRN: [id number]  Chief Complaint  Patient presents with   Annual Exam   Medication Refill    XYZAL     HPI  Allergies: Patient currently maintained on levocetirizine 5 mg daily and Flonase  nasal spray. States that allergies are controlled for the most part. States that he ran out approx 2 weeks  and when he came back with the cat has been bad.  Patient has an appointment upcoming with allergist  for complete physical and follow up of chronic conditions.  Immunizations: -Tetanus: Completed in 2023 -Influenza: update today  -Shingles: Too young -Pneumonia: Too young - HPV: Discussed in office  Diet: Fair diet. He is eating 3 meals and a small snack. He does water mainly. He has bee doing some chia tea and latte  Exercise: he does hockey once a week. He goes to the gym 3-4 times a weeks. 30-60 mins and will do weights or cardio   Eye exam: PRN Dental exam: Completes semi-annually    Colonoscopy: Too young, currently average risk Lung Cancer Screening: N/A  PSA: Too young, currently average risk  Sleep: going to bed around 11-12 and will wake up 730-830. Geels rested sometimes. States that he does not snore. States that he has interuptions. State that he will have some night sweats that he will soak the bed. He does not sleep in pajamas.  States that he will wake up with the sheets soaked.  They do have a cat that will interrupt his sleep.     03/27/2024   10:43 AM 12/07/2023   12:08 PM 02/04/2023    8:56 AM  PHQ9 SCORE ONLY  PHQ-9 Total Score 11 10 8       Data saved with a previous flowsheet row definition       03/27/2024   10:44 AM 12/07/2023   12:08 PM 02/04/2023    8:56 AM 07/01/2022   12:08 PM  GAD 7 : Generalized Anxiety Score  Nervous, Anxious, on Edge 3 3 2 2   Control/stop worrying 3 3 2 2   Worry too much - different things 3 3 2 2   Trouble  relaxing 2 2 0 2  Restless 2 0 0 0  Easily annoyed or irritable 3 2 3  0  Afraid - awful might happen 2 1 0 2  Total GAD 7 Score 18 14 9 10   Anxiety Difficulty Very difficult Somewhat difficult Somewhat difficult Very difficult           Review of Systems  Constitutional:  Negative for chills and fever.  Respiratory:  Negative for shortness of breath.   Cardiovascular:  Negative for chest pain and leg swelling.  Gastrointestinal:  Negative for abdominal pain, blood in stool, constipation, diarrhea, nausea and vomiting.       BM daily   Genitourinary:  Negative for dysuria and hematuria.  Neurological:  Negative for dizziness, tingling and headaches.  Psychiatric/Behavioral:  Negative for hallucinations and suicidal ideas.       Objective:     BP 110/78   Pulse 70   Temp 98.3 F (36.8 C) (Oral)   Ht 5' 6.5 (1.689 m)   Wt 163 lb 6.4 oz (74.1 kg)   SpO2 96%   BMI 25.98 kg/m  BP Readings from Last 3 Encounters:  03/27/24 110/78  12/07/23 102/78  02/04/23 118/84   Wt Readings from Last  3 Encounters:  03/27/24 163 lb 6.4 oz (74.1 kg)  12/07/23 161 lb 3.2 oz (73.1 kg)  02/04/23 160 lb (72.6 kg)   SpO2 Readings from Last 3 Encounters:  03/27/24 96%  12/07/23 98%  02/04/23 98%      Physical Exam Vitals and nursing note reviewed.  Constitutional:      Appearance: Normal appearance.  HENT:     Right Ear: Tympanic membrane, ear canal and external ear normal.     Left Ear: Tympanic membrane, ear canal and external ear normal.     Mouth/Throat:     Mouth: Mucous membranes are moist.     Pharynx: Oropharynx is clear.  Eyes:     Extraocular Movements: Extraocular movements intact.     Pupils: Pupils are equal, round, and reactive to light.  Cardiovascular:     Rate and Rhythm: Normal rate and regular rhythm.     Pulses: Normal pulses.     Heart sounds: Normal heart sounds.  Pulmonary:     Effort: Pulmonary effort is normal.     Breath sounds: Normal breath  sounds.  Abdominal:     General: Bowel sounds are normal. There is no distension.     Palpations: There is no mass.     Tenderness: There is no abdominal tenderness.     Hernia: No hernia is present.  Genitourinary:    Comments: Deferred Musculoskeletal:     Right lower leg: No edema.     Left lower leg: No edema.  Lymphadenopathy:     Cervical: No cervical adenopathy.  Skin:    General: Skin is warm.  Neurological:     General: No focal deficit present.     Mental Status: He is alert.     Deep Tendon Reflexes:     Reflex Scores:      Bicep reflexes are 2+ on the right side and 2+ on the left side.      Patellar reflexes are 2+ on the right side and 2+ on the left side.    Comments: Bilateral upper and lower extremity strength 5/5  Psychiatric:        Mood and Affect: Mood normal.        Behavior: Behavior normal.        Thought Content: Thought content normal.        Judgment: Judgment normal.      No results found for any visits on 03/27/24.    The ASCVD Risk score (Arnett DK, et al., 2019) failed to calculate for the following reasons:   The 2019 ASCVD risk score is only valid for ages 54 to 52   * - Cholesterol units were assumed    Assessment & Plan:   Problem List Items Addressed This Visit       Respiratory   Seasonal allergic rhinitis due to pollen   The same and allergies to cat that lives at home.  Will continue Xyzal  5 mg daily.  Refill provided today.  Patient has not appointment for consultation with allergist this week      Relevant Medications   levocetirizine (XYZAL ) 5 MG tablet     Other   Preventative health care - Primary   Discussed age-appropriate musicians and screening exams.  Did review patient's personal, surgical, social, family histories.  Patient up-to-date with all age-appropriate vaccinations he would like.  Update flu vaccine today.  Patient is too young for CRC screening or prostate cancer screening.  Patient was given  information  at discharge about preventative healthcare maintenance with anticipatory guidance.      Relevant Orders   CBC with Differential/Platelet   Comprehensive metabolic panel with GFR   TSH   Seasonal allergies   Relevant Medications   levocetirizine (XYZAL ) 5 MG tablet   GAD (generalized anxiety disorder)   Start patient on sertraline  25 mg daily for 10 days then titrate up to 50 mg daily thereafter.  Patient denies HI/SI/AVH.  Discussed routine side effects with medication use.  Patient has an appoint with therapy coming up also      Relevant Medications   sertraline  (ZOLOFT ) 50 MG tablet   Night sweats   Ambiguous nature.  Will check basic labs inclusive of CBC CMP TSH UA and chest x-ray.  Query if this is a manifestation of anxiety.      Relevant Orders   CBC with Differential/Platelet   Comprehensive metabolic panel with GFR   TSH   Urinalysis w microscopic + reflex cultur   DG Chest 2 View   Other Visit Diagnoses       Encounter for hepatitis C screening test for low risk patient       Relevant Orders   Hepatitis C antibody     Encounter for screening for HIV       Relevant Orders   HIV antibody (with reflex)     Elevated LDL cholesterol level       Relevant Orders   Lipid panel     Screening for diabetes mellitus       Relevant Orders   Hemoglobin A1c     Need for influenza vaccination       Relevant Orders   Flu vaccine trivalent PF, 6mos and older(Flulaval,Afluria,Fluarix,Fluzone) (Completed)       Return in about 6 weeks (around 05/08/2024) for GAD.    Adina Crandall, NP  "

## 2024-03-28 LAB — URINALYSIS W MICROSCOPIC + REFLEX CULTURE
Bacteria, UA: NONE SEEN /HPF
Bilirubin Urine: NEGATIVE
Glucose, UA: NEGATIVE
Hgb urine dipstick: NEGATIVE
Hyaline Cast: NONE SEEN /LPF
Ketones, ur: NEGATIVE
Leukocyte Esterase: NEGATIVE
Nitrites, Initial: NEGATIVE
Protein, ur: NEGATIVE
RBC / HPF: NONE SEEN /HPF (ref 0–2)
Specific Gravity, Urine: 1.007 (ref 1.001–1.035)
Squamous Epithelial / HPF: NONE SEEN /HPF
WBC, UA: NONE SEEN /HPF (ref 0–5)
pH: 6.5 (ref 5.0–8.0)

## 2024-03-28 LAB — NO CULTURE INDICATED

## 2024-03-28 LAB — HIV ANTIBODY (ROUTINE TESTING W REFLEX)
HIV 1&2 Ab, 4th Generation: NONREACTIVE
HIV FINAL INTERPRETATION: NEGATIVE

## 2024-03-28 LAB — HEPATITIS C ANTIBODY: Hepatitis C Ab: NONREACTIVE

## 2024-03-29 ENCOUNTER — Ambulatory Visit: Payer: Self-pay | Admitting: Allergy

## 2024-03-29 ENCOUNTER — Ambulatory Visit: Payer: Self-pay | Admitting: Nurse Practitioner

## 2024-03-29 ENCOUNTER — Ambulatory Visit

## 2024-03-29 DIAGNOSIS — L720 Epidermal cyst: Secondary | ICD-10-CM | POA: Diagnosis not present

## 2024-03-29 DIAGNOSIS — W908XXA Exposure to other nonionizing radiation, initial encounter: Secondary | ICD-10-CM | POA: Diagnosis not present

## 2024-03-29 DIAGNOSIS — L578 Other skin changes due to chronic exposure to nonionizing radiation: Secondary | ICD-10-CM | POA: Diagnosis not present

## 2024-03-29 DIAGNOSIS — Z1283 Encounter for screening for malignant neoplasm of skin: Secondary | ICD-10-CM

## 2024-03-29 DIAGNOSIS — L910 Hypertrophic scar: Secondary | ICD-10-CM | POA: Diagnosis not present

## 2024-03-29 DIAGNOSIS — L814 Other melanin hyperpigmentation: Secondary | ICD-10-CM

## 2024-03-29 DIAGNOSIS — D1801 Hemangioma of skin and subcutaneous tissue: Secondary | ICD-10-CM

## 2024-03-29 DIAGNOSIS — L700 Acne vulgaris: Secondary | ICD-10-CM | POA: Diagnosis not present

## 2024-03-29 DIAGNOSIS — D229 Melanocytic nevi, unspecified: Secondary | ICD-10-CM

## 2024-03-29 LAB — ALLERGEN PROFILE WITH TOTAL IGE, RESPIRATORY-AREA 2
Alternaria Alternata IgE: 11.1 kU/L — AB
Aspergillus Fumigatus IgE: 0.26 kU/L — AB
Bermuda Grass IgE: 0.27 kU/L — AB
Cat Dander IgE: 3.73 kU/L — AB
Cedar, Mountain IgE: 2.23 kU/L — AB
Cladosporium Herbarum IgE: 0.23 kU/L — AB
Cockroach, German IgE: <0.1 kU/L
Common Silver Birch IgE: 0.59 kU/L — AB
Cottonwood IgE: 0.78 kU/L — AB
D Farinae IgE: 0.11 kU/L — AB
D Pteronyssinus IgE: 0.13 kU/L — AB
Dog Dander IgE: 0.43 kU/L — AB
Elm, American IgE: 1.01 kU/L — AB
IgE (Immunoglobulin E), Serum: 150 [IU]/mL (ref 6–495)
Johnson Grass IgE: 0.11 kU/L — AB
Maple/Box Elder IgE: 0.99 kU/L — AB
Mouse Urine IgE: <0.1 kU/L
Oak, White IgE: 0.67 kU/L — AB
Pecan, Hickory IgE: 3.47 kU/L — AB
Penicillium Chrysogen IgE: 0.13 kU/L — AB
Pigweed, Rough IgE: 0.24 kU/L — AB
Ragweed, Short IgE: 1.57 kU/L — AB
Sheep Sorrel IgE Qn: 0.27 kU/L — AB
Timothy Grass IgE: 1.98 kU/L — AB
White Mulberry IgE: <0.1 kU/L

## 2024-03-29 MED ORDER — TRETINOIN 0.025 % EX CREA: TOPICAL_CREAM | Freq: Every day | CUTANEOUS | 5 refills | Status: AC

## 2024-03-29 MED ORDER — CLINDAMYCIN PHOSPHATE 1 % EX SWAB: 1.0000 | CUTANEOUS | 5 refills | Status: AC

## 2024-03-29 NOTE — Patient Instructions (Addendum)
 Recommend daily broad spectrum sunscreen SPF 30+ to sun-exposed areas, reapply every 2 hours as needed. Call for new or changing lesions.  Staying in the shade or wearing long sleeves, sun glasses (UVA+UVB protection) and wide brim hats (4-inch brim around the entire circumference of the hat) are also recommended for sun protection.    Melanoma ABCDEs  Melanoma is the most dangerous type of skin cancer, and is the leading cause of death from skin disease.  You are more likely to develop melanoma if you: Have light-colored skin, light-colored eyes, or red or blond hair Spend a lot of time in the sun Tan regularly, either outdoors or in a tanning bed Have had blistering sunburns, especially during childhood Have a close family member who has had a melanoma Have atypical moles or large birthmarks  Early detection of melanoma is key since treatment is typically straightforward and cure rates are extremely high if we catch it early.   The first sign of melanoma is often a change in a mole or a new dark spot.  The ABCDE system is a way of remembering the signs of melanoma.  A for asymmetry:  The two halves do not match. B for border:  The edges of the growth are irregular. C for color:  A mixture of colors are present instead of an even brown color. D for diameter:  Melanomas are usually (but not always) greater than 6mm - the size of a pencil eraser. E for evolution:  The spot keeps changing in size, shape, and color.  Please check your skin once per month between visits. You can use a small mirror in front and a large mirror behind you to keep an eye on the back side or your body.   If you see any new or changing lesions before your next follow-up, please call to schedule a visit.  Please continue daily skin protection including broad spectrum sunscreen SPF 30+ to sun-exposed areas, reapplying every 2 hours as needed when you're outdoors.   Staying in the shade or wearing long sleeves, sun  glasses (UVA+UVB protection) and wide brim hats (4-inch brim around the entire circumference of the hat) are also recommended for sun protection.    benzoyl peroxide wash 3-5% (brand name includes Neutragena Clear Pore, CereVe Acne Foaming Cream Cleanser, Differin Cleanser) that you use in the shower. Can bleach linens (clothes, towels, etc), will NOT bleach your skin or hair). Dry off with a white towel so it doesn't take the color out of clothing and colored towels.      Due to recent changes in healthcare laws, you may see results of your pathology and/or laboratory studies on MyChart before the doctors have had a chance to review them. We understand that in some cases there may be results that are confusing or concerning to you. Please understand that not all results are received at the same time and often the doctors may need to interpret multiple results in order to provide you with the best plan of care or course of treatment. Therefore, we ask that you please give us  2 business days to thoroughly review all your results before contacting the office for clarification. Should we see a critical lab result, you will be contacted sooner.   If You Need Anything After Your Visit  If you have any questions or concerns for your doctor, please call our main line at [phone] and press option 4 to reach your doctor's medical assistant. If no one answers, please  leave a voicemail as directed and we will return your call as soon as possible. Messages left after 4 pm will be answered the following business day.   You may also send us  a message via MyChart. We typically respond to MyChart messages within 1-2 business days.  For prescription refills, please ask your pharmacy to contact our office. Our fax number is [phone].  If you have an urgent issue when the clinic is closed that cannot wait until the next business day, you can page your doctor at the number below.    Please note that while we  do our best to be available for urgent issues outside of office hours, we are not available 24/7.   If you have an urgent issue and are unable to reach us , you may choose to seek medical care at your doctor's office, retail clinic, urgent care center, or emergency room.  If you have a medical emergency, please immediately call 911 or go to the emergency department.  Pager Numbers  - Dr. Hester: [phone]  - Dr. Jackquline: [phone]  - Dr. Claudene: [phone]   - Dr. Raymund: [phone]  In the event of inclement weather, please call our main line at [phone] for an update on the status of any delays or closures.  Dermatology Medication Tips: Please keep the boxes that topical medications come in in order to help keep track of the instructions about where and how to use these. Pharmacies typically print the medication instructions only on the boxes and not directly on the medication tubes.   If your medication is too expensive, please contact our office at [phone] option 4 or send us  a message through MyChart.   We are unable to tell what your co-pay for medications will be in advance as this is different depending on your insurance coverage. However, we may be able to find a substitute medication at lower cost or fill out paperwork to get insurance to cover a needed medication.   If a prior authorization is required to get your medication covered by your insurance company, please allow us  1-2 business days to complete this process.  Drug prices often vary depending on where the prescription is filled and some pharmacies may offer cheaper prices.  The website www.goodrx.com contains coupons for medications through different pharmacies. The prices here do not account for what the cost may be with help from insurance (it may be cheaper with your insurance), but the website can give you the price if you did not use any insurance.  - You can print the associated coupon and  take it with your prescription to the pharmacy.  - You may also stop by our office during regular business hours and pick up a GoodRx coupon card.  - If you need your prescription sent electronically to a different pharmacy, notify our office through [facility] or by phone at [phone] option 4.     Si Usted Necesita Algo Despus de Su Visita  Tambin puede enviarnos un mensaje a travs de Clinical Cytogeneticist. Por lo general respondemos a los mensajes de MyChart en el transcurso de 1 a 2 das hbiles.  Para renovar recetas, por favor pida a su farmacia que se ponga en contacto con nuestra oficina. Randi lakes de fax es [place] [phone].  Si tiene un asunto urgente cuando la clnica est cerrada y que no puede esperar hasta el siguiente da hbil, puede llamar/localizar a su doctor(a) al nmero que aparece a continuacin.   Por favor,  tenga en cuenta que aunque hacemos todo lo posible para estar disponibles para asuntos urgentes fuera del horario de [place], no estamos disponibles las 24 horas del da, los 7 [address]  [address] de la [place].   Si tiene un problema urgente y no puede comunicarse con nosotros, puede optar por buscar atencin mdica  en el consultorio de su doctor(a), en una clnica privada, en un centro de atencin urgente o en una sala de emergencias.  Si tiene engineer, drilling, por favor llame inmediatamente al 911 o vaya a la sala de emergencias.  Nmeros de bper  - Dr. Hester: [phone]  - Dra. Jackquline: [phone]  - Dr. Claudene: [phone]  - Dra. Kitts: [phone]  En caso de inclemencias del [place], por favor llame a nuestra lnea principal al [phone] para una actualizacin sobre el estado de cualquier retraso o cierre.  Consejos para la medicacin en dermatologa: Por favor, guarde las cajas en las que vienen los medicamentos de uso tpico para ayudarle a seguir las instrucciones sobre dnde y cmo usarlos. Las farmacias generalmente imprimen las  instrucciones del medicamento slo en las cajas y no directamente en los tubos del [place].   Si su medicamento es muy caro, por favor, pngase en contacto con landry rieger llamando al [phone] y presione la opcin 4 o envenos un mensaje a travs de Clinical Cytogeneticist.   No podemos decirle cul ser su copago por los medicamentos por adelantado ya que esto es diferente dependiendo de la cobertura de su seguro. Sin embargo, es posible que podamos encontrar un medicamento sustituto a audiological scientist un formulario para que el seguro cubra el medicamento que se considera necesario.   Si se requiere una autorizacin previa para que su compaa de seguros cubra su medicamento, por favor permtanos de 1 a 2 das hbiles para completar este proceso.  Los precios de los medicamentos varan con frecuencia dependiendo del environmental consultant de dnde se surte la receta y alguna farmacias pueden ofrecer precios ms baratos.  El sitio web www.goodrx.com tiene cupones para medicamentos de health and safety inspector. Los precios aqu no tienen en cuenta lo que podra costar con la ayuda del seguro (puede ser ms barato con su seguro), pero el sitio web puede darle el precio si no utiliz tourist information centre manager.  - Puede imprimir el cupn correspondiente y llevarlo con su receta a la farmacia.  - Tambin puede pasar por nuestra oficina durante el horario de atencin regular y education officer, museum una tarjeta de cupones de GoodRx.  - Si necesita que su receta se enve electrnicamente a una farmacia diferente, informe a nuestra oficina a travs de MyChart de [place] o por telfono llamando al [phone] y presione la opcin 4.

## 2024-03-29 NOTE — Progress Notes (Signed)
 "   Subjective   Dillon Park is a 29 y.o. male who presents for the following: Total body skin exam for skin cancer screening and mole check. The patient has spots, moles and lesions to be evaluated, some may be new or changing and the patient may have concern these could be cancer.. Patient is new patient  Today patient reports: Cyst chest, neck, penis, one on chest and penis tender, moles on arms and chest concerned about no change in them  Review of Systems:    No other skin or systemic complaints except as noted in HPI or Assessment and Plan.  The following portions of the chart were reviewed this encounter and updated as appropriate: medications, allergies, medical history  Relevant Medical History:  n/a   Objective  (SKPE) Well appearing patient in no apparent distress; mood and affect are within normal limits. Examination was performed of the: Full Skin Examination: scalp, head, eyes, ears, nose, lips, neck, chest, axillae, abdomen, back, buttocks, bilateral upper extremities, bilateral lower extremities, hands, feet, fingers, toes, fingernails, and toenails.   Examination notable for: SKIN EXAM, Lentigo/lentigines: Scattered pigmented macules that are tan to brown in color and are somewhat non-uniform in shape and concentrated in the sun-exposed areas, Nevus/nevi: Scattered well-demarcated, regular, pigmented macule(s) and/or papule(s)  , Acne vulgaris: Scattered open and closed comedones on the face. Red, inflammatory papules and pustules on face  - A firm, skin-colored, subcutaneous nodule that is freely movableon anterior shaft penis, keloid firm nodule at the chest EIC post neck    Examination limited by: N/A    Assessment & Plan  (SKAP)   SKIN CANCER SCREENING PERFORMED TODAY.  BENIGN SKIN FINDINGS  - Lentigines  - Nevus/Multiple Benign Nevi - Reassurance provided regarding the benign appearance of lesions noted on exam today; no treatment is indicated in the absence  of symptoms/changes. - Reinforced importance of photoprotective strategies including liberal and frequent sunscreen use of a broad-spectrum SPF 30 or greater, use of protective clothing, and sun avoidance for prevention of cutaneous malignancy and photoaging.  Counseled patient on the importance of regular self-skin monitoring as well as routine clinical skin examinations as scheduled.   ACTINIC DAMAGE - Chronic condition, secondary to cumulative UV/sun exposure - Recommend daily broad spectrum sunscreen SPF 30+ to sun-exposed areas, reapply every 2 hours as needed.  - Staying in the shade or wearing long sleeves, sun glasses (UVA+UVB protection) and wide brim hats (4-inch brim around the entire circumference of the hat) are also recommended for sun protection.  - Call for new or changing lesions.    Acne vulgaris - moderate, comedonal, and inflammatory - Chronic and persistent condition with duration or expected duration over one year. Condition is symptomatic and bothersome to patient. Patient is flaring and not currently at treatment goal.  - Discussed various treatment options with patient, as well as need for consistent use for at least 6-12 weeks for full efficacy.  - Reviewed treatment options, including side effects of topical agents, oral antibiotics, OCPs (if male), oral spironolactone (if male), and isotretinoin. Discussed that isotretinoin is the most effective  - After discussion opted to initiate:  Start topical clindamycin  swabs 1% once daily to active areas    - Tretinoin  0.025% cream qhs as tolerated - Explained this will cause irritation, dryness, and peeling with initial use, therefore start every 3 nights and increase frequency slowly - start benzoyl peroxide 5% wash in the morning. Educated patient about proper use and potential side  effects, including dryness, irritation, and bleaching of fabrics.   Start topical clindamycin  swabs 1% once daily to active areas - chest and  back   Subcutaneous cyst, favor epidermal inclusion cyst - back  Cyst - anterior shaft of penis  - Explained to patient this most likely is consistent with an epidermal inclusion cyst, which represents trapped hair follicule and skin cells under the skin - Benign, patient reassured - Given that penile lesion is symptomatic, patient would like to proceed with excision. Referral to Urology placed for cyst on penis.  Keloid  - Discussed treatment options including topical and intralesional therapies as well as excision. - will rediscuss at next visit   Was sun protection counseling provided?: Yes   Level of service outlined above   Patient instructions (SKPI)   Procedures, orders, diagnosis for this visit:  EPIDERMAL CYST   This Visit - Ambulatory referral to Urology  Epidermal cyst -     Ambulatory referral to Urology  Other orders -     Clindamycin  Phosphate; Apply 1 Application topically as directed. Apply to the affected area of acne qam  Dispense: 60 each; Refill: 5 -     Tretinoin ; Apply topically at bedtime. Apply 2-3 times weekly at night to dry skin after cleaning. Increase frequency up to nightly as tolerated.  Dispense: 20 g; Refill: 5    Return to clinic: Return for 2-22m Acne f/u.  I, Grayce Saunas, RMA, am acting as scribe for Lauraine JAYSON Kanaris, MD .   Documentation: I have reviewed the above documentation for accuracy and completeness, and I agree with the above.  Lauraine JAYSON Kanaris, MD  "

## 2024-04-06 DIAGNOSIS — L720 Epidermal cyst: Secondary | ICD-10-CM | POA: Insufficient documentation

## 2024-04-06 NOTE — Assessment & Plan Note (Signed)
 Penile shaft inclusion cyst  - symptomatic, wants removed

## 2024-04-06 NOTE — Progress Notes (Unsigned)
" ° °  04/06/24 3:56 PM   Dillon Park Sep 18, 1995 [id number]   HPI: 29 y.o. male here for initial evaluation of penile inclusion cyst  Saw Dermatology 03/29/24  - dorsal penile shaft cyst  - felt to be epidermal inclusion cyst  - patient wants removed    PMH: Past Medical History:  Diagnosis Date   Allergy 2020   I have had seasonal allergies since I was early teens   Anxiety 2023   This is something I have had the last few years   Concussion 2009   x2 (football/baseball)   COVID-19 virus infection 09/2019   GERD (gastroesophageal reflux disease) 2019   Very rare, but happens on occasion   Hyperlipidemia 02/04/2023   Zoster 11/2009    Surgical History: Past Surgical History:  Procedure Laterality Date   BUNIONECTOMY Right 2022   WISDOM TOOTH EXTRACTION      Family History: Family History  Problem Relation Age of Onset   Cancer Mother 55       breast cancer   Migraines Mother    Diabetes Father    Kidney Stones Father    Healthy Brother     Social History:  reports that he has never smoked. He has never used smokeless tobacco. He reports current alcohol use of about 2.0 standard drinks of alcohol per week. He reports that he does not use drugs.      Physical Exam: There were no vitals taken for this visit.   Constitutional:  Alert and oriented, No acute distress. Cardiovascular: No clubbing, cyanosis, or edema. Respiratory: Normal respiratory effort, no increased work of breathing. GI: Nondistended GU: *** Skin: No rashes, bruises or suspicious lesions. Neurologic: Grossly intact, no focal deficits, moving all 4 extremities. Psychiatric: Normal mood and affect.  Laboratory Data: N/A   Pertinent Imaging: N/A    Assessment & Plan:    Epidermal inclusion cyst Assessment & Plan: Penile shaft inclusion cyst  - symptomatic, wants removed       [name], MD 04/06/2024  [facility] [address], [address] [place],  [place] [address] [phone] "

## 2024-04-13 ENCOUNTER — Ambulatory Visit: Admitting: Urology

## 2024-04-13 DIAGNOSIS — L720 Epidermal cyst: Secondary | ICD-10-CM

## 2024-04-18 NOTE — Progress Notes (Signed)
" ° °  04/21/24 10:17 AM   Dillon Park [birth year] [id number]   HPI: 29 y.o. male here for initial evaluation of penile inclusion cyst  Saw Dermatology 03/29/24  - dorsal penile shaft cyst  - felt to be epidermal inclusion cyst  - patient wants removed  - Refer to urology  Small cyst has been present for a number of years Wants removed for cosmesis and interference with condom use    PMH: Past Medical History:  Diagnosis Date   Allergy 2020   I have had seasonal allergies since I was early teens   Anxiety 2023   This is something I have had the last few years   Concussion 2009   x2 (football/baseball)   COVID-19 virus infection 09/2019   GERD (gastroesophageal reflux disease) 2019   Very rare, but happens on occasion   Hyperlipidemia 02/04/2023   Zoster 11/2009    Surgical History: Past Surgical History:  Procedure Laterality Date   BUNIONECTOMY Right 2022   WISDOM TOOTH EXTRACTION      Family History: Family History  Problem Relation Age of Onset   Cancer Mother 63       breast cancer   Migraines Mother    Diabetes Father    Kidney Stones Father    Healthy Brother     Social History:  reports that he has never smoked. He has never used smokeless tobacco. He reports current alcohol use of about 2.0 standard drinks of alcohol per week. He reports that he does not use drugs.      Physical Exam: BP 112/66   Pulse 70   Ht 5' 8 (1.727 m)   Wt 167 lb (75.8 kg)   BMI 25.39 kg/m    Constitutional:  Alert and oriented, No acute distress. Cardiovascular: No clubbing, cyanosis, or edema. Respiratory: Normal respiratory effort, no increased work of breathing. GI: Nondistended GU: Circumcised phallus, small subcentimeter inclusion cyst at the left distal dorsal penile skin.  Very superficial and easily mobile Skin: No rashes, bruises or suspicious lesions. Neurologic: Grossly intact, no focal deficits, moving all 4 extremities. Psychiatric: Normal mood and  affect.  Laboratory Data: N/A   Pertinent Imaging: N/A    Assessment & Plan:    Epidermal inclusion cyst Assessment & Plan: Penile shaft inclusion cyst  - symptomatic, wants removed  Superficial subcentimeter cyst.  I would be happy to excise in clinic.  Explained the steps of this procedure under local anesthesia, would take 10-15 minutes.  He would have a couple of dissolvable stitches, healing should be fairly quick although may expect some discomfort and edema due to sensitive area.  - Will schedule him for penile cyst excision in clinic, next available        Dillon Skye, MD 04/21/2024  [facility] [address], [address] [place], [place] [address] [phone] "

## 2024-04-18 NOTE — Assessment & Plan Note (Addendum)
 Penile shaft inclusion cyst  - symptomatic, wants removed  Superficial subcentimeter cyst.  I would be happy to excise in clinic.  Explained the steps of this procedure under local anesthesia, would take 10-15 minutes.  He would have a couple of dissolvable stitches, healing should be fairly quick although may expect some discomfort and edema due to sensitive area.  - Will schedule him for penile cyst excision in clinic, next available

## 2024-04-19 ENCOUNTER — Other Ambulatory Visit: Payer: Self-pay | Admitting: Nurse Practitioner

## 2024-04-19 DIAGNOSIS — F411 Generalized anxiety disorder: Secondary | ICD-10-CM

## 2024-04-21 ENCOUNTER — Ambulatory Visit: Admitting: Urology

## 2024-04-21 VITALS — BP 112/66 | HR 70 | Ht 68.0 in | Wt 167.0 lb

## 2024-04-21 DIAGNOSIS — L720 Epidermal cyst: Secondary | ICD-10-CM

## 2024-04-27 NOTE — Progress Notes (Unsigned)
" ° °  05/03/2024 7:46 AM   Dillon Park [birth year] [id number]  Penile Cyst Excision Procedure Note:  After informed consent and discussion of the procedure and its risks, Dillon Park was positioned and prepped in the standard fashion.   Anesthesia: 10cc of 1 % lidocaine Suture material: 3-0 chromic Estimated blood loss: <1 ml   ***  Patient tolerated the procedure well.  Complications: None  Assessment and Plan: - Follow up on as needed basis  [name], MD 04/27/2024   "

## 2024-05-03 ENCOUNTER — Ambulatory Visit: Admitting: Urology

## 2024-05-09 ENCOUNTER — Ambulatory Visit: Admitting: Nurse Practitioner

## 2024-05-29 ENCOUNTER — Ambulatory Visit

## 2024-09-27 ENCOUNTER — Ambulatory Visit: Admitting: Allergy
